# Patient Record
Sex: Male | Born: 1940 | Race: White | Hispanic: No | Marital: Married | Smoking: Former smoker
Health system: Southern US, Community
[De-identification: ages and names within clinical notes are randomized; demographics above are authoritative.]

## PROBLEM LIST (undated history)

## (undated) DIAGNOSIS — E7800 Pure hypercholesterolemia, unspecified: Secondary | ICD-10-CM

## (undated) DIAGNOSIS — M1990 Unspecified osteoarthritis, unspecified site: Secondary | ICD-10-CM

## (undated) DIAGNOSIS — Z7901 Long term (current) use of anticoagulants: Secondary | ICD-10-CM

## (undated) DIAGNOSIS — I4892 Unspecified atrial flutter: Secondary | ICD-10-CM

## (undated) DIAGNOSIS — I10 Essential (primary) hypertension: Secondary | ICD-10-CM

## (undated) DIAGNOSIS — N200 Calculus of kidney: Secondary | ICD-10-CM

## (undated) DIAGNOSIS — R001 Bradycardia, unspecified: Secondary | ICD-10-CM

## (undated) DIAGNOSIS — C61 Malignant neoplasm of prostate: Secondary | ICD-10-CM

## (undated) HISTORY — PX: SHOULDER DEBRIDEMENT: SHX1052

## (undated) HISTORY — PX: KIDNEY SURGERY: SHX687

## (undated) HISTORY — PX: PROSTATE SURGERY: SHX751

## (undated) HISTORY — PX: KIDNEY STONE SURGERY: SHX686

## (undated) HISTORY — PX: JOINT REPLACEMENT: SHX530

## (undated) HISTORY — PX: THYROID SURGERY: SHX805

## (undated) HISTORY — PX: TOTAL KNEE ARTHROPLASTY: SHX125

## (undated) HISTORY — PX: BACK SURGERY: SHX140

---

## 2009-12-22 ENCOUNTER — Emergency Department (HOSPITAL_BASED_OUTPATIENT_CLINIC_OR_DEPARTMENT_OTHER): Admission: EM | Admit: 2009-12-22 | Discharge: 2009-12-22 | Payer: Self-pay | Admitting: Emergency Medicine

## 2010-10-25 ENCOUNTER — Emergency Department (HOSPITAL_BASED_OUTPATIENT_CLINIC_OR_DEPARTMENT_OTHER)
Admission: EM | Admit: 2010-10-25 | Discharge: 2010-10-25 | Disposition: A | Discharge status: Home / Self Care | Payer: Medicare Other | Attending: Emergency Medicine | Admitting: Emergency Medicine

## 2010-10-25 DIAGNOSIS — I10 Essential (primary) hypertension: Secondary | ICD-10-CM | POA: Insufficient documentation

## 2010-10-25 DIAGNOSIS — Z794 Long term (current) use of insulin: Secondary | ICD-10-CM | POA: Insufficient documentation

## 2010-10-25 DIAGNOSIS — Z79899 Other long term (current) drug therapy: Secondary | ICD-10-CM | POA: Insufficient documentation

## 2010-10-25 DIAGNOSIS — R42 Dizziness and giddiness: Secondary | ICD-10-CM | POA: Insufficient documentation

## 2010-10-25 DIAGNOSIS — Z8639 Personal history of other endocrine, nutritional and metabolic disease: Secondary | ICD-10-CM | POA: Insufficient documentation

## 2010-10-25 DIAGNOSIS — E119 Type 2 diabetes mellitus without complications: Secondary | ICD-10-CM | POA: Insufficient documentation

## 2010-10-25 DIAGNOSIS — R110 Nausea: Secondary | ICD-10-CM | POA: Insufficient documentation

## 2010-10-25 DIAGNOSIS — Z862 Personal history of diseases of the blood and blood-forming organs and certain disorders involving the immune mechanism: Secondary | ICD-10-CM | POA: Insufficient documentation

## 2010-10-25 LAB — BASIC METABOLIC PANEL
BUN: 21 mg/dL (ref 6–23)
Calcium: 9.7 mg/dL (ref 8.4–10.5)
GFR calc non Af Amer: >60 mL/min (ref >60)
Glucose, Bld: 206 mg/dL — ABNORMAL HIGH (ref 70–99)

## 2010-10-25 LAB — PROTIME-INR: Prothrombin Time: 22.8 seconds — ABNORMAL HIGH (ref 11.6–15.2)

## 2010-10-25 LAB — CBC
MCH: 28.1 pg (ref 26.0–34.0)
MCHC: 33.4 g/dL (ref 30.0–36.0)
Platelets: 136 10*3/uL — ABNORMAL LOW (ref 150–400)
RDW: 15 % (ref 11.5–15.5)

## 2011-11-05 ENCOUNTER — Emergency Department (INDEPENDENT_AMBULATORY_CARE_PROVIDER_SITE_OTHER): Payer: Medicare Other

## 2011-11-05 ENCOUNTER — Encounter (HOSPITAL_BASED_OUTPATIENT_CLINIC_OR_DEPARTMENT_OTHER): Payer: Self-pay | Admitting: *Deleted

## 2011-11-05 ENCOUNTER — Emergency Department (HOSPITAL_BASED_OUTPATIENT_CLINIC_OR_DEPARTMENT_OTHER)
Admission: EM | Admit: 2011-11-05 | Discharge: 2011-11-05 | Disposition: A | Discharge status: Home / Self Care | Payer: Medicare Other | Attending: Emergency Medicine | Admitting: Emergency Medicine

## 2011-11-05 DIAGNOSIS — M5380 Other specified dorsopathies, site unspecified: Secondary | ICD-10-CM | POA: Insufficient documentation

## 2011-11-05 DIAGNOSIS — E7800 Pure hypercholesterolemia, unspecified: Secondary | ICD-10-CM | POA: Insufficient documentation

## 2011-11-05 DIAGNOSIS — X58XXXA Exposure to other specified factors, initial encounter: Secondary | ICD-10-CM | POA: Insufficient documentation

## 2011-11-05 DIAGNOSIS — M62830 Muscle spasm of back: Secondary | ICD-10-CM

## 2011-11-05 DIAGNOSIS — E119 Type 2 diabetes mellitus without complications: Secondary | ICD-10-CM | POA: Insufficient documentation

## 2011-11-05 DIAGNOSIS — R109 Unspecified abdominal pain: Secondary | ICD-10-CM | POA: Insufficient documentation

## 2011-11-05 DIAGNOSIS — N200 Calculus of kidney: Secondary | ICD-10-CM

## 2011-11-05 DIAGNOSIS — S335XXA Sprain of ligaments of lumbar spine, initial encounter: Secondary | ICD-10-CM | POA: Insufficient documentation

## 2011-11-05 DIAGNOSIS — S39012A Strain of muscle, fascia and tendon of lower back, initial encounter: Secondary | ICD-10-CM

## 2011-11-05 DIAGNOSIS — M5417 Radiculopathy, lumbosacral region: Secondary | ICD-10-CM

## 2011-11-05 DIAGNOSIS — IMO0002 Reserved for concepts with insufficient information to code with codable children: Secondary | ICD-10-CM | POA: Insufficient documentation

## 2011-11-05 DIAGNOSIS — Z79899 Other long term (current) drug therapy: Secondary | ICD-10-CM | POA: Insufficient documentation

## 2011-11-05 DIAGNOSIS — I10 Essential (primary) hypertension: Secondary | ICD-10-CM | POA: Insufficient documentation

## 2011-11-05 HISTORY — DX: Pure hypercholesterolemia, unspecified: E78.00

## 2011-11-05 HISTORY — DX: Essential (primary) hypertension: I10

## 2011-11-05 HISTORY — DX: Unspecified osteoarthritis, unspecified site: M19.90

## 2011-11-05 LAB — BASIC METABOLIC PANEL
BUN: 32 mg/dL — ABNORMAL HIGH (ref 6–23)
Calcium: 9.4 mg/dL (ref 8.4–10.5)
Creatinine, Ser: 1 mg/dL (ref 0.50–1.35)
GFR calc Af Amer: 86 mL/min — ABNORMAL LOW (ref >90)
GFR calc non Af Amer: 74 mL/min — ABNORMAL LOW (ref >90)
Potassium: 4.1 mEq/L (ref 3.5–5.1)

## 2011-11-05 LAB — CBC
Hemoglobin: 14.3 g/dL (ref 13.0–17.0)
MCH: 27.6 pg (ref 26.0–34.0)
MCHC: 33.3 g/dL (ref 30.0–36.0)

## 2011-11-05 LAB — DIFFERENTIAL
Basophils Relative: 0 % (ref 0–1)
Eosinophils Absolute: 0.3 10*3/uL (ref 0.0–0.7)
Eosinophils Relative: 5 % (ref 0–5)
Monocytes Relative: 8 % (ref 3–12)
Neutrophils Relative %: 51 % (ref 43–77)

## 2011-11-05 LAB — URINALYSIS, ROUTINE W REFLEX MICROSCOPIC
Bilirubin Urine: NEGATIVE
Ketones, ur: NEGATIVE mg/dL
Nitrite: NEGATIVE
Urobilinogen, UA: 0.2 mg/dL (ref 0.0–1.0)
pH: 7 (ref 5.0–8.0)

## 2011-11-05 MED ORDER — ONDANSETRON HCL 4 MG/2ML IJ SOLN
4.0000 mg | Freq: Once | INTRAMUSCULAR | Status: AC
  Administered 2011-11-05: 4 mg via INTRAVENOUS
  Filled 2011-11-05: qty 2

## 2011-11-05 MED ORDER — HYDROMORPHONE HCL PF 2 MG/ML IJ SOLN
2.0000 mg | Freq: Once | INTRAMUSCULAR | Status: AC
  Administered 2011-11-05: 2 mg via INTRAVENOUS
  Filled 2011-11-05: qty 1

## 2011-11-05 MED ORDER — DIAZEPAM 5 MG PO TABS: 5.0000 mg | ORAL_TABLET | Freq: Four times a day (QID) | ORAL | Status: AC | PRN

## 2011-11-05 MED ORDER — DIAZEPAM 5 MG PO TABS
5.0000 mg | ORAL_TABLET | Freq: Once | ORAL | Status: AC
  Administered 2011-11-05: 5 mg via ORAL
  Filled 2011-11-05: qty 1

## 2011-11-05 MED ORDER — OXYCODONE-ACETAMINOPHEN 5-325 MG PO TABS: 2.0000 | ORAL_TABLET | ORAL | Status: AC | PRN

## 2011-11-05 MED ORDER — PREDNISONE 20 MG PO TABS: 40.0000 mg | ORAL_TABLET | Freq: Every day | ORAL | Status: AC

## 2011-11-05 MED ORDER — KETOROLAC TROMETHAMINE 15 MG/ML IJ SOLN
15.0000 mg | Freq: Once | INTRAMUSCULAR | Status: AC
  Administered 2011-11-05: 15 mg via INTRAVENOUS
  Filled 2011-11-05: qty 1

## 2011-11-05 MED ORDER — HYDROMORPHONE HCL PF 1 MG/ML IJ SOLN
1.0000 mg | Freq: Once | INTRAMUSCULAR | Status: AC
  Administered 2011-11-05: 1 mg via INTRAVENOUS
  Filled 2011-11-05: qty 1

## 2011-11-05 NOTE — ED Notes (Signed)
Pt. Reports he has R flank pain into the R groin area.  Pt. Reports no burning or hurting with urination.

## 2011-11-05 NOTE — Discharge Instructions (Signed)
Back Pain, Adult Low back pain is very common. About 1 in 5 people have back pain.The cause of low back pain is rarely dangerous. The pain often gets better over time.About half of people with a sudden onset of back pain feel better in just 2 weeks. About 8 in 10 people feel better by 6 weeks.  CAUSES Some common causes of back pain include:  Strain of the muscles or ligaments supporting the spine.   Wear and tear (degeneration) of the spinal discs.   Arthritis.   Direct injury to the back.  DIAGNOSIS Most of the time, the direct cause of low back pain is not known.However, back pain can be treated effectively even when the exact cause of the pain is unknown.Answering your caregiver's questions about your overall health and symptoms is one of the most accurate ways to make sure the cause of your pain is not dangerous. If your caregiver needs more information, he or she may order lab work or imaging tests (X-rays or MRIs).However, even if imaging tests show changes in your back, this usually does not require surgery. HOME CARE INSTRUCTIONS For many people, back pain returns.Since low back pain is rarely dangerous, it is often a condition that people can learn to manageon their own.   Remain active. It is stressful on the back to sit or stand in one place. Do not sit, drive, or stand in one place for more than 30 minutes at a time. Take short walks on level surfaces as soon as pain allows.Try to increase the length of time you walk each day.   Do not stay in bed.Resting more than 1 or 2 days can delay your recovery.   Do not avoid exercise or work.Your body is made to move.It is not dangerous to be active, even though your back may hurt.Your back will likely heal faster if you return to being active before your pain is gone.   Pay attention to your body when you bend and lift. Many people have less discomfortwhen lifting if they bend their knees, keep the load close to their  bodies,and avoid twisting. Often, the most comfortable positions are those that put less stress on your recovering back.   Find a comfortable position to sleep. Use a firm mattress and lie on your side with your knees slightly bent. If you lie on your back, put a pillow under your knees.   Only take over-the-counter or prescription medicines as directed by your caregiver. Over-the-counter medicines to reduce pain and inflammation are often the most helpful.Your caregiver may prescribe muscle relaxant drugs.These medicines help dull your pain so you can more quickly return to your normal activities and healthy exercise.   Put ice on the injured area.   Put ice in a plastic bag.   Place a towel between your skin and the bag.   Leave the ice on for 15 to 20 minutes, 3 to 4 times a day for the first 2 to 3 days. After that, ice and heat may be alternated to reduce pain and spasms.   Ask your caregiver about trying back exercises and gentle massage. This may be of some benefit.   Avoid feeling anxious or stressed.Stress increases muscle tension and can worsen back pain.It is important to recognize when you are anxious or stressed and learn ways to manage it.Exercise is a great option.  SEEK MEDICAL CARE IF:  You have pain that is not relieved with rest or medicine.   You have   pain that does not improve in 1 week.   You have new symptoms.   You are generally not feeling well.  SEEK IMMEDIATE MEDICAL CARE IF:   You have pain that radiates from your back into your legs.   You develop new bowel or bladder control problems.   You have unusual weakness or numbness in your arms or legs.   You develop nausea or vomiting.   You develop abdominal pain.   You feel faint.  Document Released: 08/10/2005 Document Revised: 07/30/2011 Document Reviewed: 12/29/2010 [facility] Patient Information 2012 [place], [place].Cryotherapy Cryotherapy means treatment with cold. Ice or gel packs can be  used to reduce both pain and swelling. Ice is the most helpful within the first 24 to 48 hours after an injury or flareup from overusing a muscle or joint. Sprains, strains, spasms, burning pain, shooting pain, and aches can all be eased with ice. Ice can also be used when recovering from surgery. Ice is effective, has very few side effects, and is safe for most people to use. PRECAUTIONS  Ice is not a safe treatment option for people with:  Raynaud's phenomenon. This is a condition affecting small blood vessels in the extremities. Exposure to cold may cause your problems to return.   Cold hypersensitivity. There are many forms of cold hypersensitivity, including:   Cold urticaria. Red, itchy hives appear on the skin when the tissues begin to warm after being iced.   Cold erythema. This is a red, itchy rash caused by exposure to cold.   Cold hemoglobinuria. Red blood cells break down when the tissues begin to warm after being iced. The hemoglobin that carry oxygen are passed into the urine because they cannot combine with blood proteins fast enough.   Numbness or altered sensitivity in the area being iced.  If you have any of the following conditions, do not use ice until you have discussed cryotherapy with your caregiver:  Heart conditions, such as arrhythmia, angina, or chronic heart disease.   High blood pressure.   Healing wounds or open skin in the area being iced.   Current infections.   Rheumatoid arthritis.   Poor circulation.   Diabetes.  Ice slows the blood flow in the region it is applied. This is beneficial when trying to stop inflamed tissues from spreading irritating chemicals to surrounding tissues. However, if you expose your skin to cold temperatures for too long or without the proper protection, you can damage your skin or nerves. Watch for signs of skin damage due to cold. HOME CARE INSTRUCTIONS Follow these tips to use ice and cold packs safely.  Place a dry or  damp towel between the ice and skin. A damp towel will cool the skin more quickly, so you may need to shorten the time that the ice is used.   For a more rapid response, add gentle compression to the ice.   Ice for no more than 10 to 20 minutes at a time. The bonier the area you are icing, the less time it will take to get the benefits of ice.   Check your skin after 5 minutes to make sure there are no signs of a poor response to cold or skin damage.   Rest 20 minutes or more in between uses.   Once your skin is numb, you can end your treatment. You can test numbness by very lightly touching your skin. The touch should be so light that you do not see the skin dimple from  the pressure of your fingertip. When using ice, most people will feel these normal sensations in this order: cold, burning, aching, and numbness.   Do not use ice on someone who cannot communicate their responses to pain, such as small children or people with dementia.  HOW TO MAKE AN ICE PACK Ice packs are the most common way to use ice therapy. Other methods include ice massage, ice baths, and cryo-sprays. Muscle creams that cause a cold, tingly feeling do not offer the same benefits that ice offers and should not be used as a substitute unless recommended by your caregiver. To make an ice pack, do one of the following:  Place crushed ice or a bag of frozen vegetables in a sealable plastic bag. Squeeze out the excess air. Place this bag inside another plastic bag. Slide the bag into a pillowcase or place a damp towel between your skin and the bag.   Mix 3 parts water with 1 part rubbing alcohol. Freeze the mixture in a sealable plastic bag. When you remove the mixture from the freezer, it will be slushy. Squeeze out the excess air. Place this bag inside another plastic bag. Slide the bag into a pillowcase or place a damp towel between your skin and the bag.  SEEK MEDICAL CARE IF:  You develop white spots on your skin. This  may give the skin a blotchy (mottled) appearance.   Your skin turns blue or pale.   Your skin becomes waxy or hard.   Your swelling gets worse.  MAKE SURE YOU:   Understand these instructions.   Will watch your condition.   Will get help right away if you are not doing well or get worse.  Document Released: 04/06/2011 Document Revised: 07/30/2011 Document Reviewed: 04/06/2011 [facility] Patient Information 2012 [place], [place].Lumbosacral Strain Lumbosacral strain is one of the most common causes of back pain. There are many causes of back pain. Most are not serious conditions. CAUSES  Your backbone (spinal column) is made up of 24 main vertebral bodies, the sacrum, and the coccyx. These are held together by muscles and tough, fibrous tissue (ligaments). Nerve roots pass through the openings between the vertebrae. A sudden move or injury to the back may cause injury to, or pressure on, these nerves. This may result in localized back pain or pain movement (radiation) into the buttocks, down the leg, and into the foot. Sharp, shooting pain from the buttock down the back of the leg (sciatica) is frequently associated with a ruptured (herniated) disk. Pain may be caused by muscle spasm alone. Your caregiver can often find the cause of your pain by the details of your symptoms and an exam. In some cases, you may need tests (such as X-rays). Your caregiver will work with you to decide if any tests are needed based on your specific exam. HOME CARE INSTRUCTIONS   Avoid an underactive lifestyle. Active exercise, as directed by your caregiver, is your greatest weapon against back pain.   Avoid hard physical activities (tennis, racquetball, waterskiing) if you are not in proper physical condition for it. This may aggravate or create problems.   If you have a back problem, avoid sports requiring sudden body movements. Swimming and walking are generally safer activities.   Maintain good posture.    Avoid becoming overweight (obese).   Use bed rest for only the most extreme, sudden (acute) episode. Your caregiver will help you determine how much bed rest is necessary.   For acute conditions, you may  put ice on the injured area.   Put ice in a plastic bag.   Place a towel between your skin and the bag.   Leave the ice on for 15 to 20 minutes at a time, every 2 hours, or as needed.   After you are improved and more active, it may help to apply heat for 30 minutes before activities.  See your caregiver if you are having pain that lasts longer than expected. Your caregiver can advise appropriate exercises or therapy if needed. With conditioning, most back problems can be avoided. SEEK IMMEDIATE MEDICAL CARE IF:   You have numbness, tingling, weakness, or problems with the use of your arms or legs.   You experience severe back pain not relieved with medicines.   There is a change in bowel or bladder control.   You have increasing pain in any area of the body, including your belly (abdomen).   You notice shortness of breath, dizziness, or feel faint.   You feel sick to your stomach (nauseous), are throwing up (vomiting), or become sweaty.   You notice discoloration of your toes or legs, or your feet get very cold.   Your back pain is getting worse.   You have a fever.  MAKE SURE YOU:   Understand these instructions.   Will watch your condition.   Will get help right away if you are not doing well or get worse.  Document Released: 05/20/2005 Document Revised: 07/30/2011 Document Reviewed: 11/09/2008 [facility] Patient Information 2012 [place], [place].Lumbosacral Radiculopathy Lumbosacral radiculopathy is a pinched nerve or nerves in the low back (lumbosacral area). When this happens you may have weakness in your legs and may not be able to stand on your toes. You may have pain going down into your legs. There may be difficulties with walking normally. There are many causes  of this problem. Sometimes this may happen from an injury, or simply from arthritis or boney problems. It may also be caused by other illnesses such as diabetes. If there is no improvement after treatment, further studies may be done to find the exact cause. DIAGNOSIS  X-rays may be needed if the problems become long standing. Electromyograms may be done. This study is one in which the working of nerves and muscles is studied. HOME CARE INSTRUCTIONS   Applications of ice packs may be helpful. Ice can be used in a plastic bag with a towel around it to prevent frostbite to skin. This may be used every 2 hours for 20 to 30 minutes, or as needed, while awake, or as directed by your caregiver.   Only take over-the-counter or prescription medicines for pain, discomfort, or fever as directed by your caregiver.   If physical therapy was prescribed, follow your caregiver's directions.  SEEK IMMEDIATE MEDICAL CARE IF:   You have pain not controlled with medications.   You seem to be getting worse rather than better.   You develop increasing weakness in your legs.   You develop loss of bowel or bladder control.   You have difficulty with walking or balance, or develop clumsiness in the use of your legs.   You have a fever.  MAKE SURE YOU:   Understand these instructions.   Will watch your condition.   Will get help right away if you are not doing well or get worse.  Document Released: 08/10/2005 Document Revised: 07/30/2011 Document Reviewed: 03/30/2008 [facility] Patient Information 2012 [place], [place].

## 2011-11-05 NOTE — ED Provider Notes (Addendum)
History     CSN: [id number]  Arrival date & time 11/05/11  1313   First MD Initiated Contact with Patient 11/05/11 1406      Chief Complaint  Patient presents with  . Flank Pain    R side into the R groin area    (Consider location/radiation/quality/duration/timing/severity/associated sxs/prior treatment) HPI Comments: The patient has a known history of intrarenal kidney stone found incidentally in the past on x-ray, but he has never passed a kidney stone before. He reports 3-4 days ago insidious onset of right flank pain, dull, aching, nonradiating, fleeting and intermittent, that over the last 24 hours has gradually become worse in severity, now severe, localized to the right flank, right abdomen, and right groin. He denies any dysuria, hematuria, urinary hesitancy or urinary frequency. He does report some nausea.  Patient is a 71 y.o. male presenting with flank pain. The history is provided by the patient.  Flank Pain This is a new problem. The current episode started more than 2 days ago (3-4 days ago, the pain onset was mild and fleeting, and subsequently has become severe and persistent, originally at the right flank, now at the right abdomen and right groin). The problem occurs constantly. The problem has been gradually worsening. Associated symptoms include abdominal pain. Pertinent negatives include no chest pain, no headaches and no shortness of breath. The symptoms are aggravated by nothing. The symptoms are relieved by nothing. He has tried nothing for the symptoms.    Past Medical History  Diagnosis Date  . Arthritis   . Gout   . Diabetes mellitus   . Hypertension   . High cholesterol   . Atrial fibrillation     was placed on coumadin     Past Surgical History  Procedure Date  . Kidney stone surgery   . Thyroid surgery   . Kidney surgery   . Shoulder debridement     L and R  . Prostate surgery     had prostate cancer approx. 3 yrs ago    No family history on  file.  History  Substance Use Topics  . Smoking status: Not on file  . Smokeless tobacco: Not on file  . Alcohol Use:       Review of Systems  Constitutional: Negative for fever, chills, activity change, appetite change and fatigue.  HENT: Negative.   Eyes: Negative.   Respiratory: Negative for cough, shortness of breath and wheezing.   Cardiovascular: Negative for chest pain.  Gastrointestinal: Positive for nausea, vomiting and abdominal pain. Negative for diarrhea, constipation, blood in stool, abdominal distention, anal bleeding and rectal pain.  Genitourinary: Positive for urgency and flank pain. Negative for dysuria, frequency, hematuria, decreased urine volume, discharge, difficulty urinating, genital sores, penile pain and testicular pain.  Musculoskeletal: Negative.   Skin: Negative for color change and rash.  Neurological: Negative for light-headedness, numbness and headaches.  Hematological: Does not bruise/bleed easily.  Psychiatric/Behavioral: Negative.     Allergies  Penicillins  Home Medications   Current Outpatient Rx  Name Route Sig Dispense Refill  . ACYCLOVIR 200 MG PO CAPS Oral Take by mouth every 4 (four) hours while awake.    Marland Kitchen ALLOPURINOL 300 MG PO TABS Oral Take 300 mg by mouth daily.    . CHLORTHALIDONE 25 MG PO TABS Oral Take 25 mg by mouth daily.    . FENOFIBRATE 160 MG PO TABS Oral Take 160 mg by mouth daily.    . INSULIN GLARGINE 100 UNIT/ML [place]  SOLN Subcutaneous Inject into the skin at bedtime.    . INSULIN LISPRO (HUMAN) 100 UNIT/ML [place] SOLN Subcutaneous Inject into the skin 3 (three) times daily before meals.    Marland Kitchen LOSARTAN POTASSIUM-HCTZ 100-12.5 MG PO TABS Oral Take 1 tablet by mouth daily.    Marland Kitchen METFORMIN HCL 1000 MG PO TABS Oral Take 1,000 mg by mouth 2 (two) times daily with a meal.    . OMEPRAZOLE 40 MG PO CPDR Oral Take 40 mg by mouth daily.    Marland Kitchen PREGABALIN 100 MG PO CAPS Oral Take 100 mg by mouth 2 (two) times daily.    Marland Kitchen SIMVASTATIN 10 MG  PO TABS Oral Take 10 mg by mouth at bedtime.    . WARFARIN SODIUM 5 MG PO TABS Oral Take 5 mg by mouth daily.      BP 131/65  Pulse 50  Temp(Src) 97.6 F (36.4 C) (Oral)  Resp 20  Ht 6' (1.829 m)  Wt 253 lb (114.76 kg)  BMI 34.31 kg/m2  SpO2 98%  Physical Exam  Nursing note and vitals reviewed. Constitutional: He is oriented to person, place, and time. He appears well-nourished. He appears distressed.  HENT:  Head: Normocephalic and atraumatic.  Eyes: EOM are normal. Pupils are equal, round, and reactive to light.  Neck: Normal range of motion. Neck supple. No JVD present.  Cardiovascular: Normal rate, regular rhythm, normal heart sounds and intact distal pulses.  Exam reveals no gallop and no friction rub.   No murmur heard. Pulmonary/Chest: Effort normal and breath sounds normal. No respiratory distress. He has no wheezes. He has no rales. He exhibits no tenderness.  Abdominal: Soft. Bowel sounds are normal. He exhibits no distension, no fluid wave, no ascites and no mass. There is no hepatosplenomegaly. There is tenderness. There is CVA tenderness. There is no rebound and no guarding. No hernia.  Musculoskeletal: Normal range of motion. He exhibits no edema and no tenderness.  Neurological: He is alert and oriented to person, place, and time. He has normal reflexes. No cranial nerve deficit. He exhibits normal muscle tone. Coordination normal.  Skin: Skin is warm and dry. No rash noted. He is not diaphoretic. No erythema. No pallor.  Psychiatric: He has a normal mood and affect. His behavior is normal. Judgment and thought content normal.    ED Course  Procedures (including critical care time)   Labs Reviewed  URINALYSIS, ROUTINE W REFLEX MICROSCOPIC  CBC  DIFFERENTIAL  BASIC METABOLIC PANEL   No results found.   No diagnosis found.    MDM  The patient has right flank pain radiating to the right abdomen and right groin, severe in intensity, associated with nausea,  and not associated with recent injury. He has a known kidney stone on the right side from prior x-ray he tells me, and his symptoms are strongly suggestive of acute ureteric lithiasis and renal colic secondary to a kidney stone. I have ordered him analgesics and anti-emetics to treat his symptoms while awaiting results of blood work, urinalysis, and CT scan to evaluate further.       Felisa Bonier, MD 11/05/11 1450  4:15 PM The patient's urinalysis and CT scan do not suggest ureteric lithiasis or kidney stone as the etiology of his symptoms. Looking at the degenerative changes in his lumbar spine, while there are no acute fractures or dislocations, I think likely the patient is experiencing radicular lumbar pain, likely secondary to muscle strain and spasm. I have given the patient  muscle relaxant in the emergency department at this time he is awake, alert, and oriented appropriately and states his pain is significantly improved, now 3/10 in intensity. I have discussed with him the results of his tests and what I now think to be the diagnosis of lumbar strain, muscle spasm, and radicular lumbar pain. The patient states his understanding of these findings and diagnosis, as well as my plan to treat him with analgesics, muscle relaxants, and a brief course of corticosteroids. I have instructed him on carefully monitoring his blood glucose during use of corticosteroids in adjusting his insulin use accordingly. Additionally, I have instructed him that he needs to followup with his primary care physician within the next 4 days to be reevaluated and to have his Coumadin level, his INR rechecked to assure that the steroids have not significantly altered the INR. He states his understanding of and agreement with the plan of care.  Felisa Bonier, MD 11/05/11 [phone]

## 2012-05-01 ENCOUNTER — Observation Stay (HOSPITAL_BASED_OUTPATIENT_CLINIC_OR_DEPARTMENT_OTHER)
Admission: EM | Admit: 2012-05-01 | Discharge: 2012-05-03 | Disposition: A | Discharge status: Home / Self Care | Payer: Medicare Other | Attending: Internal Medicine | Admitting: Internal Medicine

## 2012-05-01 ENCOUNTER — Emergency Department (HOSPITAL_BASED_OUTPATIENT_CLINIC_OR_DEPARTMENT_OTHER): Payer: Medicare Other

## 2012-05-01 ENCOUNTER — Encounter (HOSPITAL_BASED_OUTPATIENT_CLINIC_OR_DEPARTMENT_OTHER): Payer: Self-pay | Admitting: Emergency Medicine

## 2012-05-01 DIAGNOSIS — Z7901 Long term (current) use of anticoagulants: Secondary | ICD-10-CM

## 2012-05-01 DIAGNOSIS — R0789 Other chest pain: Secondary | ICD-10-CM | POA: Insufficient documentation

## 2012-05-01 DIAGNOSIS — I4891 Unspecified atrial fibrillation: Secondary | ICD-10-CM | POA: Diagnosis present

## 2012-05-01 DIAGNOSIS — R0609 Other forms of dyspnea: Secondary | ICD-10-CM | POA: Insufficient documentation

## 2012-05-01 DIAGNOSIS — I10 Essential (primary) hypertension: Secondary | ICD-10-CM | POA: Diagnosis present

## 2012-05-01 DIAGNOSIS — E119 Type 2 diabetes mellitus without complications: Secondary | ICD-10-CM | POA: Diagnosis present

## 2012-05-01 DIAGNOSIS — E785 Hyperlipidemia, unspecified: Secondary | ICD-10-CM | POA: Insufficient documentation

## 2012-05-01 DIAGNOSIS — M549 Dorsalgia, unspecified: Principal | ICD-10-CM | POA: Diagnosis present

## 2012-05-01 DIAGNOSIS — R0989 Other specified symptoms and signs involving the circulatory and respiratory systems: Secondary | ICD-10-CM | POA: Insufficient documentation

## 2012-05-01 DIAGNOSIS — I4892 Unspecified atrial flutter: Secondary | ICD-10-CM | POA: Diagnosis present

## 2012-05-01 DIAGNOSIS — R001 Bradycardia, unspecified: Secondary | ICD-10-CM

## 2012-05-01 DIAGNOSIS — I498 Other specified cardiac arrhythmias: Secondary | ICD-10-CM | POA: Insufficient documentation

## 2012-05-01 HISTORY — DX: Unspecified atrial flutter: I48.92

## 2012-05-01 HISTORY — DX: Malignant neoplasm of prostate: C61

## 2012-05-01 HISTORY — DX: Long term (current) use of anticoagulants: Z79.01

## 2012-05-01 HISTORY — DX: Bradycardia, unspecified: R00.1

## 2012-05-01 LAB — GLUCOSE, CAPILLARY
Glucose-Capillary: 111 mg/dL — ABNORMAL HIGH (ref 70–99)
Glucose-Capillary: 130 mg/dL — ABNORMAL HIGH (ref 70–99)

## 2012-05-01 LAB — COMPREHENSIVE METABOLIC PANEL
ALT: 16 U/L (ref 0–53)
AST: 18 U/L (ref 0–37)
CO2: 29 mEq/L (ref 19–32)
Calcium: 9.3 mg/dL (ref 8.4–10.5)
Chloride: 101 mEq/L (ref 96–112)
GFR calc Af Amer: >90 mL/min (ref >90)
GFR calc non Af Amer: 84 mL/min — ABNORMAL LOW (ref >90)
Glucose, Bld: 178 mg/dL — ABNORMAL HIGH (ref 70–99)
Sodium: 142 mEq/L (ref 135–145)
Total Bilirubin: 0.5 mg/dL (ref 0.3–1.2)

## 2012-05-01 LAB — CBC WITH DIFFERENTIAL/PLATELET
Basophils Absolute: 0 10*3/uL (ref 0.0–0.1)
Eosinophils Relative: 4 % (ref 0–5)
HCT: 45.1 % (ref 39.0–52.0)
Lymphocytes Relative: 35 % (ref 12–46)
Lymphs Abs: 2.7 10*3/uL (ref 0.7–4.0)
MCV: 82.6 fL (ref 78.0–100.0)
Monocytes Absolute: 0.7 10*3/uL (ref 0.1–1.0)
Neutro Abs: 4.1 10*3/uL (ref 1.7–7.7)
Platelets: 146 10*3/uL — ABNORMAL LOW (ref 150–400)
RBC: 5.46 MIL/uL (ref 4.22–5.81)
RDW: 17.7 % — ABNORMAL HIGH (ref 11.5–15.5)
WBC: 7.8 10*3/uL (ref 4.0–10.5)

## 2012-05-01 MED ORDER — HYDROCHLOROTHIAZIDE 12.5 MG PO CAPS: 12.5000 mg | ORAL_CAPSULE | Freq: Every day | ORAL | Status: DC

## 2012-05-01 MED ORDER — SODIUM CHLORIDE 0.9 % IJ SOLN
3.0000 mL | Freq: Two times a day (BID) | INTRAMUSCULAR | Status: DC
  Administered 2012-05-01: 3 mL via INTRAVENOUS
  Administered 2012-05-02: 11:00:00 via INTRAVENOUS
  Administered 2012-05-02: 3 mL via INTRAVENOUS

## 2012-05-01 MED ORDER — CHLORTHALIDONE 25 MG PO TABS
25.0000 mg | ORAL_TABLET | Freq: Every day | ORAL | Status: DC
  Administered 2012-05-02 – 2012-05-03 (×2): 25 mg via ORAL
  Filled 2012-05-01 (×2): qty 1

## 2012-05-01 MED ORDER — ONDANSETRON HCL 4 MG/2ML IJ SOLN
4.0000 mg | Freq: Once | INTRAMUSCULAR | Status: AC
  Administered 2012-05-01: 4 mg via INTRAVENOUS
  Filled 2012-05-01: qty 2

## 2012-05-01 MED ORDER — IOHEXOL 350 MG/ML SOLN
100.0000 mL | Freq: Once | INTRAVENOUS | Status: AC | PRN
  Administered 2012-05-01: 100 mL via INTRAVENOUS

## 2012-05-01 MED ORDER — LOSARTAN POTASSIUM 50 MG PO TABS
100.0000 mg | ORAL_TABLET | Freq: Every day | ORAL | Status: DC
  Administered 2012-05-02 – 2012-05-03 (×2): 100 mg via ORAL
  Filled 2012-05-01 (×2): qty 2

## 2012-05-01 MED ORDER — WARFARIN SODIUM 7.5 MG PO TABS
7.5000 mg | ORAL_TABLET | Freq: Once | ORAL | Status: AC
  Administered 2012-05-01: 7.5 mg via ORAL
  Filled 2012-05-01: qty 1

## 2012-05-01 MED ORDER — ONDANSETRON HCL 4 MG/2ML IJ SOLN: 4.0000 mg | Freq: Three times a day (TID) | INTRAMUSCULAR | Status: AC | PRN

## 2012-05-01 MED ORDER — NITROGLYCERIN IN D5W 200-5 MCG/ML-% IV SOLN
2.0000 ug/min | Freq: Once | INTRAVENOUS | Status: AC
  Administered 2012-05-01: 2 ug/min via INTRAVENOUS
  Filled 2012-05-01: qty 250

## 2012-05-01 MED ORDER — MORPHINE SULFATE 4 MG/ML IJ SOLN
4.0000 mg | Freq: Once | INTRAMUSCULAR | Status: AC
  Administered 2012-05-01: 4 mg via INTRAVENOUS
  Filled 2012-05-01: qty 1

## 2012-05-01 MED ORDER — ZOLPIDEM TARTRATE 5 MG PO TABS
5.0000 mg | ORAL_TABLET | Freq: Every evening | ORAL | Status: DC | PRN
  Administered 2012-05-02: 5 mg via ORAL
  Filled 2012-05-01: qty 1

## 2012-05-01 MED ORDER — PREGABALIN 100 MG PO CAPS
200.0000 mg | ORAL_CAPSULE | Freq: Three times a day (TID) | ORAL | Status: DC
  Administered 2012-05-01 – 2012-05-03 (×5): 200 mg via ORAL
  Filled 2012-05-01 (×2): qty 2
  Filled 2012-05-01 (×2): qty 4
  Filled 2012-05-01: qty 2

## 2012-05-01 MED ORDER — FENOFIBRATE 160 MG PO TABS
160.0000 mg | ORAL_TABLET | Freq: Every day | ORAL | Status: DC
  Administered 2012-05-02 – 2012-05-03 (×2): 160 mg via ORAL
  Filled 2012-05-01 (×2): qty 1

## 2012-05-01 MED ORDER — ACETAMINOPHEN 325 MG PO TABS
650.0000 mg | ORAL_TABLET | ORAL | Status: DC | PRN
  Administered 2012-05-01 – 2012-05-03 (×2): 650 mg via ORAL
  Filled 2012-05-01 (×2): qty 2

## 2012-05-01 MED ORDER — INSULIN ASPART 100 UNIT/ML ~~LOC~~ SOLN
0.0000 [IU] | Freq: Three times a day (TID) | SUBCUTANEOUS | Status: DC
  Administered 2012-05-02: 2 [IU] via SUBCUTANEOUS
  Administered 2012-05-02: 3 [IU] via SUBCUTANEOUS

## 2012-05-01 MED ORDER — INSULIN GLARGINE 100 UNIT/ML ~~LOC~~ SOLN
70.0000 [IU] | Freq: Every day | SUBCUTANEOUS | Status: DC
  Administered 2012-05-01 – 2012-05-02 (×2): 70 [IU] via SUBCUTANEOUS

## 2012-05-01 MED ORDER — PANTOPRAZOLE SODIUM 40 MG PO TBEC
40.0000 mg | DELAYED_RELEASE_TABLET | Freq: Every day | ORAL | Status: DC
  Administered 2012-05-02: 40 mg via ORAL
  Filled 2012-05-01: qty 1

## 2012-05-01 MED ORDER — WARFARIN - PHARMACIST DOSING INPATIENT
Freq: Every day | Status: DC
  Administered 2012-05-01: 18:00:00

## 2012-05-01 MED ORDER — ALPRAZOLAM 0.25 MG PO TABS: 0.2500 mg | ORAL_TABLET | Freq: Two times a day (BID) | ORAL | Status: DC | PRN

## 2012-05-01 MED ORDER — ONDANSETRON HCL 4 MG/2ML IJ SOLN: 4.0000 mg | Freq: Four times a day (QID) | INTRAMUSCULAR | Status: DC | PRN

## 2012-05-01 MED ORDER — ALLOPURINOL 300 MG PO TABS
300.0000 mg | ORAL_TABLET | Freq: Every day | ORAL | Status: DC
  Administered 2012-05-02 – 2012-05-03 (×2): 300 mg via ORAL
  Filled 2012-05-01 (×2): qty 1

## 2012-05-01 MED ORDER — NITROGLYCERIN 0.4 MG SL SUBL
0.4000 mg | SUBLINGUAL_TABLET | SUBLINGUAL | Status: DC | PRN
  Administered 2012-05-01: 0.4 mg via SUBLINGUAL
  Filled 2012-05-01: qty 25

## 2012-05-01 MED ORDER — LOSARTAN POTASSIUM-HCTZ 100-12.5 MG PO TABS: 1.0000 | ORAL_TABLET | Freq: Every day | ORAL | Status: DC

## 2012-05-01 MED ORDER — SODIUM CHLORIDE 0.9 % IJ SOLN: 3.0000 mL | INTRAMUSCULAR | Status: DC | PRN

## 2012-05-01 MED ORDER — TRAMADOL HCL 50 MG PO TABS
100.0000 mg | ORAL_TABLET | Freq: Four times a day (QID) | ORAL | Status: DC
  Administered 2012-05-01 – 2012-05-02 (×6): 100 mg via ORAL
  Filled 2012-05-01 (×9): qty 2

## 2012-05-01 MED ORDER — SODIUM CHLORIDE 0.9 % IV SOLN: 250.0000 mL | INTRAVENOUS | Status: DC | PRN

## 2012-05-01 MED ORDER — SIMVASTATIN 40 MG PO TABS
40.0000 mg | ORAL_TABLET | Freq: Every evening | ORAL | Status: DC
  Administered 2012-05-01 – 2012-05-02 (×2): 40 mg via ORAL
  Filled 2012-05-01 (×4): qty 1

## 2012-05-01 MED ORDER — NITROGLYCERIN 0.4 MG SL SUBL: 0.4000 mg | SUBLINGUAL_TABLET | SUBLINGUAL | Status: DC | PRN

## 2012-05-01 MED ORDER — INSULIN ASPART PROT & ASPART (70-30 MIX) 100 UNIT/ML ~~LOC~~ SUSP: 20.0000 [IU] | Freq: Three times a day (TID) | SUBCUTANEOUS | Status: DC

## 2012-05-01 MED ORDER — INSULIN ASPART 100 UNIT/ML ~~LOC~~ SOLN
20.0000 [IU] | Freq: Three times a day (TID) | SUBCUTANEOUS | Status: DC
Start: 2012-05-02 — End: 2012-05-01

## 2012-05-01 MED ORDER — ASPIRIN 81 MG PO CHEW
324.0000 mg | CHEWABLE_TABLET | Freq: Once | ORAL | Status: AC
  Administered 2012-05-01: 324 mg via ORAL
  Filled 2012-05-01: qty 4

## 2012-05-01 NOTE — ED Notes (Signed)
Patient transported to radiology on cardiac monitor and oxygen. Tolerated well.

## 2012-05-01 NOTE — H&P (Signed)
History and Physical   Patient ID: Alan Carey MRN: [id number], DOB/AGE: October 11, 1940 71 y.o. Date of Encounter: 05/01/2012  Primary Physician: Dr Synetta [facility] Primary Cardiologist: Dr Deno Etienne with [facility]  Chief Complaint:  Back pain  HPI: [name] is a 71 year old male with a history of atrial arrhythmia (pt not sure if fibrillation or flutter) maintained on warfarin. He developed back pain primarily localized to the mid back. The pain began insidiously a few days ago and has been progressive. Per ER records, he went to [facility] and rec'd pain meds, but the pain continued and intensified. He does not think it got worse with deep inspiration. It was not exertional. There was no relationship to movement of the trunk or upper extremities.  There was no apparent precipitating injury.  The pain was severe 8/10, his wife could not even touch his back without extreme pain. He went to [facility] and, when asked, could not remember the names of his physicians or where he had been seen in the past. His back discomfort did not respond to 4 mg of morphine, but did improve after SL nitroglycerin. He and his wife were asked their preference and requested [facility] instead of [facility], so he was sent here.  Currently, he is resting comfortably. He does not recall prior episodes of these symptoms, but has undergone lumbar surgery in the remote past. He remembers the names of his chronic pain meds but does not know the dosages and cannot remember his other meds.Lifestyle is sedentary, but denies a history of exertional chest pain.  His wife reports that he experiences class 2-3 exertional dyspnea. He is not aware of his irregular heart rate and denies any history of lightheadedness, presyncope, syncope or falls.  He was reassured that we will be able to get any information from his regular physicians and he can follow up with them after discharge.  Past  Medical History  Diagnosis Date  . Arthritis   . Gout   . Diabetes mellitus   . Hypertension   . High cholesterol   . Atrial fibrillation     was placed on coumadin     Surgical History:  Past Surgical History  Procedure Date  . Kidney stone surgery   . Thyroid surgery   . Kidney surgery   . Shoulder debridement     L and R  . Prostate surgery     had prostate cancer approx. 3 yrs ago    I have reviewed the patient's current medications. Medication Sig  . allopurinol (ZYLOPRIM) 300 MG tablet Take 300 mg by mouth daily.  . chlorthalidone (HYGROTON) 25 MG tablet Take 25 mg by mouth daily.  . fenofibrate 160 MG tablet Take 160 mg by mouth daily.  . insulin glargine (LANTUS) 100 UNIT/ML injection Inject 70 Units into the skin at bedtime.   . insulin lispro (HUMALOG) 100 UNIT/ML injection Inject 20 Units into the skin 3 (three) times daily before meals.   Marland Kitchen losartan-hydrochlorothiazide  100-12.5 MG per tablet Take 1 tablet by mouth daily.  . metoprolol (LOPRESSOR) 100 MG tablet Take 50 mg by mouth 2 (two) times daily.  . pregabalin (LYRICA) 100 MG capsule Take 200 mg by mouth 3 (three) times daily.   Marland Kitchen warfarin (COUMADIN) 5 MG tablet Take 7.5-10 mg by mouth daily. Take 10 MG on Mondays and Tuesdays; on Wednesday, Thursday, Friday, Saturday, and Sunday, take 7.5 MG  . DISCONTD: metFORMIN 1000 MG  tablet Take 1,000 mg by mouth 2 times daily with a meal.  . DISCONTD: omeprazole (PRILOSEC) 40 MG capsule Take 40 mg by mouth daily.  Marland Kitchen DISCONTD: simvastatin (ZOCOR) 10 MG tablet Take 10 mg by mouth at bedtime.    Scheduled Meds:   . aspirin  324 mg Oral Once  .  morphine injection  4 mg Intravenous Once  . nitroGLYCERIN  2-200 mcg/min Intravenous Once  . ondansetron  4 mg Intravenous Once    Allergies  Allergen Reactions  . Penicillins Itching and Rash    History   Social History  . Marital Status: Married    Spouse Name: N/A    Number of Children: N/A  . Years of Education:  N/A   Occupational History  . Retired Art therapist of [employer]    Social History Main Topics  . Smoking status: Former Games developer  . Smokeless tobacco: Never Used   Comment: Quit 35 years ago  . Alcohol Use: No  . Drug Use: No  . Sexually Active: Not on file   Other Topics Concern  . Not on file   Social History Narrative   Originally from [place], lived in [place] 43 years.   Review of Systems: Pt told [facility] staff he had a stress test at some point in the past and thinks it was OK, no further details available. His stomach does not give him trouble. He denies any bleeding history. No recent illnesses, fevers or chills. Full 14-point review of systems otherwise negative except as noted above.  Physical Exam: Blood pressure 119/86, pulse 65, temperature 97.7 F (36.5 C), temperature source Oral, resp. rate 13, height 6' (1.829 m), weight 251 lb 1.7 oz (113.9 kg), SpO2 99.00%. General: Well developed, well nourished, moderately overweight male in no acute distress. Head: Normocephalic, atraumatic, sclera non-icteric, no xanthomas, nares are without discharge. Dentition: poor Neck: No carotid bruits. JVD mildly elevated. No thyromegally Lungs: Good expansion bilaterally. without wheezes or rhonchi. Rales bases, no crackles Heart: IRRegular rate and rhythm with S1 S2.  No S3 or S4.  No murmur, no rubs, or gallops appreciated. Abdomen: Soft, non-tender, non-distended with normoactive bowel sounds. No hepatomegaly. No rebound/guarding. No obvious abdominal masses.  Msk:  Strength and tone appear normal for age. Chronic joint deformities, no joint effusions, no spine or costo-vertebral angle tenderness. Extremities: No clubbing or cyanosis. No edema.  Distal pedal pulses are 2+ in 4 extrem Neuro: Alert and oriented X 3. Moves all extremities spontaneously. No focal deficits noted. Psych:  Responds to questions appropriately with a normal affect. Skin: No rashes or lesions  noted  Labs:   Lab Results  Component Value Date   WBC 7.8 31-May-2012   HGB 14.6 31-May-2012   HCT 45.1 May 31, 2012   MCV 82.6 2012/05/31   PLT 146* 05/31/2012    Basename May 31, 2012 1115  INR 1.99*    Lab 31-May-2012 1115  NA 142  K 3.6  CL 101  CO2 29  BUN 22  CREATININE 0.90  CALCIUM 9.3  PROT 7.0  BILITOT 0.5  ALKPHOS 62  ALT 16  AST 18  GLUCOSE 178*    Basename 05-31-12 1115  CKTOTAL --  CKMB --  TROPONINI <0.30   Pro B Natriuretic peptide (BNP)  Date/Time Value Range Status  05-31-2012 11:15 AM 524.1* 0 - 125 pg/mL Final   Radiology/Studies: Ct Angio Abdomen +Chest May 31, 2012: mild ectasia of ascending aorta; mild to moderate cardiomegaly; possible prior anterior MI with coronary  artery calcifications; small hiatal hernia; 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  Mild ASVD of the abdominal aorta; multilevel DDD of LS spine; nephrolithiasis in the renal pelvis bilaterally; prior cholecystectomy; renal cysts; duodenal diverticulum   ECG: 01-May-2012 11:01:11  atrial flutter with variable A-V block and ventricular rate of 40 bpm; slightly delayed R wave progression; NSST & T wave abnormality  ASSESSMENT AND PLAN:    *Back pain - low likelihood that etiology is myocardial ischemia. cycle enzymes, D/C IV NTG and follow symptoms. MD advise about further eval.   Atrial flutter - slow VR, d/c metoprolol and allow HR to come up. Further eval and Rx based on response to holding med.    Hypertension - MD advise med changes for better control.  Chronic anticoag - continue coumadin.   DM - continue Lantus with SSI.  Rhonda Barrett PA-C 05/01/2012, 3:26 PM  Cardiology Attending Patient interviewed and examined. Discussed with Theodore Demark, PA-C.  Above note annotated and modified based upon my findings.  Pt seen for back pain that does not have the characteristics of angina or an acute ischemic syndrome.  ED physician concerned that NTG appeared to relieve pain, but this is not  particularly helpful in establishing etiology.  More likely based upon history is a musculoskeletal etiology or passage of a renal calculus.  IV NTG to be discontinued.  Serial cardiac markers ordered.  If patient continues asymptomatic, markers prove normal and HR increases, he can be discharged in the AM to follow with his [facility].  [place] Bing, MD 05/01/2012, 11:34 PM

## 2012-05-01 NOTE — Progress Notes (Signed)
ANTICOAGULATION CONSULT NOTE - Initial Consult  Pharmacy Consult for coumadin Indication: atrial fibrillation  Allergies  Allergen Reactions  . Penicillins Itching and Rash    Patient Measurements: Height: 6' (182.9 cm) Weight: 251 lb 1.7 oz (113.9 kg) IBW/kg (Calculated) : 77.6   Vital Signs: Temp: 97.7 F (36.5 C) (09/08 1445) Temp src: Oral (09/08 1445) BP: 119/86 mmHg (09/08 1500) Pulse Rate: 65  (09/08 1500)  Labs:  Basename 05/01/12 1115  HGB 14.6  HCT 45.1  PLT 146*  APTT --  LABPROT 22.9*  INR 1.99*  HEPARINUNFRC --  CREATININE 0.90  CKTOTAL --  CKMB --  TROPONINI <0.30    Estimated Creatinine Clearance: 99.5 ml/min (by C-G formula based on Cr of 0.9).   Medical History: Past Medical History  Diagnosis Date  . Arthritis   . Gout   . Diabetes mellitus   . Hypertension   . High cholesterol   . Atrial fibrillation     was placed on coumadin   . Diabetes mellitus   . Chronic anticoagulation   . Cancer     Assessment: 71 yo male with history of afib here for back pain. Patient to continue coumadin during admission (INR=1.99; home dose 10 mg Mon & Tues, 7.5 mg all other days)  Goal of Therapy:  INR 2-3 Monitor platelets by anticoagulation protocol: Yes   Plan:  -Coumadin 7.5mg  today based on home regimen -PT/INR daily for now  Harland German, Pharm D 05/01/2012 5:55 PM

## 2012-05-01 NOTE — ED Notes (Signed)
Pt having upper back pain for two days.  Was seen at [facility] yesterday given pain medication and took xray.  Pt states pain is continuing.  No known injury.

## 2012-05-02 LAB — URINALYSIS, ROUTINE W REFLEX MICROSCOPIC
Glucose, UA: NEGATIVE mg/dL
Leukocytes, UA: NEGATIVE
Nitrite: NEGATIVE
Protein, ur: NEGATIVE mg/dL
Urobilinogen, UA: 1 mg/dL (ref 0.0–1.0)

## 2012-05-02 LAB — PROTIME-INR: Prothrombin Time: 35.7 seconds — ABNORMAL HIGH (ref 11.6–15.2)

## 2012-05-02 LAB — TSH: TSH: 0.868 u[IU]/mL (ref 0.350–4.500)

## 2012-05-02 LAB — GLUCOSE, CAPILLARY: Glucose-Capillary: 211 mg/dL — ABNORMAL HIGH (ref 70–99)

## 2012-05-02 MED ORDER — MORPHINE SULFATE 2 MG/ML IJ SOLN
INTRAMUSCULAR | Status: AC
  Filled 2012-05-02: qty 1

## 2012-05-02 MED ORDER — MORPHINE SULFATE 2 MG/ML IJ SOLN
2.0000 mg | INTRAMUSCULAR | Status: DC | PRN
  Administered 2012-05-02 (×3): 2 mg via INTRAVENOUS
  Filled 2012-05-02 (×2): qty 1

## 2012-05-02 NOTE — Progress Notes (Signed)
Notified Md pt c/o pain in lower back. Given other PRN medications without relief.  Hr 34-42 BP 141/66.  New orders received.  Educated pt to call RN if need bathroom assistance.  Will continue to monitor.  Alan Carey

## 2012-05-02 NOTE — Progress Notes (Signed)
Patient ID: Alan Carey, male   DOB: Sep 20, 1940, 71 y.o.   MRN: [id number]    Subjective:  Denies SSCP, palpitations or Dyspnea Back pain is better Sees [facility]  Objective:  Filed Vitals:   05/02/12 0104 05/02/12 0130 05/02/12 0140 05/02/12 0325  BP: 141/66   128/58  Pulse:      Temp:    97.4 F (36.3 C)  TempSrc:    Oral  Resp:      Height:      Weight:    246 lb 14.6 oz (112 kg)  SpO2:  93% 90% 96%    Intake/Output from previous day:  Intake/Output Summary (Last 24 hours) at 05/02/12 0805 Last data filed at 05/01/12 2300  Gross per 24 hour  Intake    420 ml  Output    300 ml  Net    120 ml    Physical Exam: Affect appropriate Healthy:  appears stated age HEENT: normal Neck supple with no adenopathy JVP normal no bruits no thyromegaly Lungs clear with no wheezing and good diaphragmatic motion Heart:  S1/S2 no murmur, no rub, gallop or click PMI normal Abdomen: benighn, BS positve, no tenderness, no AAA no bruit.  No HSM or HJR Distal pulses intact with no bruits No edema Neuro non-focal Skin warm and dry No muscular weakness   Lab Results: Basic Metabolic Panel:  Basename 05/01/12 1115  NA 142  K 3.6  CL 101  CO2 29  GLUCOSE 178*  BUN 22  CREATININE 0.90  CALCIUM 9.3  MG --  PHOS --   Liver Function Tests:  Basename 05/01/12 1115  AST 18  ALT 16  ALKPHOS 62  BILITOT 0.5  PROT 7.0  ALBUMIN 3.9   No results found for this basename: LIPASE:2,AMYLASE:2 in the last 72 hours CBC:  Basename 05/01/12 1115  WBC 7.8  NEUTROABS 4.1  HGB 14.6  HCT 45.1  MCV 82.6  PLT 146*   Cardiac Enzymes:  Basename 05/01/12 1115  CKTOTAL --  CKMB --  CKMBINDEX --  TROPONINI <0.30    Imaging: Ct Angio Pelvis W/cm &/or Wo/cm  05/01/2012  *RADIOLOGY REPORT*  Clinical Data:  Some onset of upper back pain.  Evaluate for potential aortic dissection.  CT ANGIOGRAPHY CHEST, ABDOMEN AND PELVIS  Technique:  Multidetector CT imaging  through the chest, abdomen and pelvis was performed using the standard protocol during bolus administration of intravenous contrast.  Multiplanar reconstructed images including MIPs were obtained and reviewed to evaluate the vascular anatomy.  Contrast: OMNIPAQUE IOHEXOL 350 MG/ML SOLN,  Comparison:  CT of abdomen and pelvis 11/05/2011.  CTA CHEST  Findings:  Mediastinum: On precontrast images, there is no crescentic high attenuation associated with the wall of the thoracic aorta to suggest the presence of acute intramural hemorrhage.  Post contrast images demonstrate no aneurysm, dissection or penetrating aortic ulcer associated with the thoracic aorta.  Ascending thoracic aorta is mildly ectatic measuring 4.2 cm.  The arch measures 2.8 cm, and the descending thoracic aorta measures 2.8 cm in diameter as well. Heart size is  enlarged. There is no significant pericardial fluid, thickening or pericardial calcification. There is atherosclerosis of the thoracic aorta, the great vessels of the mediastinum and the coronary arteries, including calcified atherosclerotic plaque in the left anterior descending, left circumflex and right coronary arteries.  Mild thinning of the left ventricular myocardium in the region of the mid ventricular anteroseptal wall segment.  Calcified mediastinal and right hilar lymph nodes are  incidentally noted. No pathologically enlarged mediastinal or hilar lymph nodes. There is a small hiatal hernia.  The left lobe of the thyroid gland is slightly enlarged and heterogeneous in appearance.  Lungs/Pleura: Small area of subsegmental atelectasis or scarring in the lateral segment of the right middle lobe.  The no acute consolidative air space disease.  No pleural effusions.  The no definite suspicious appearing pulmonary nodules or masses are identified.  Small area of pleural-based scarring in the left apex.  Musculoskeletal: There are no aggressive appearing lytic or blastic lesions noted  in the visualized portions of the skeleton.   Review of the MIP images confirms the above findings.  IMPRESSION: 1.  While there is very mild ectasia of the ascending thoracic aorta (4.2 cm in diameter), there are no findings to suggest acute aortic syndrome at this time.  2.  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  There is atherosclerosis, including at least three vessel coronary artery disease.  Assessment for potential risk factor modification, dietary therapy or pharmacologic therapy may be warranted, if clinically indicated. 3.  Small hiatal hernia. 4. The left lobe of the thyroid gland is slightly enlarged and heterogeneous in appearance.  This is nonspecific, and likely reflects a goiter.  However, if there is clinical concern for thyroid disease or neoplasm, further evaluation with non emergent thyroid ultrasound may be warranted. 5.  Additional incidental findings, as above.  CTA ABDOMEN AND PELVIS  Findings:  Abdomen/Pelvis:  Precontrast images demonstrate no crescentic areas of high attenuation associated with the wall of the abdominal aorta to suggest acute intramural hemorrhage.  There is mild atherosclerosis of the abdominal and pelvic vasculature, without definite aneurysm or dissection.  The celiac axis, superior mesenteric artery and inferior mesenteric artery and their major branches are all widely patent.  There are several small nonobstructive calculi within the collecting systems of the kidneys bilaterally, largest of which measures up to 6 mm in the lower pole of the right kidney.  No ureteral calculi or findings to suggest urinary tract obstruction at this time.  Circumaortic left renal vein (a normal anatomical variant) is incidentally noted.  The left kidney has a altered axis (normal variant), which is unlikely to be related to any  acute symptomatology.  Multiple low attenuation lesions in the kidneys bilaterally, the largest of which measures the largest of which measures 2.0 x 1.6 cm in the upper pole of the left kidney.  The larger of these lesions are all compatible with simple cysts, while the smaller lesions are too small to definitively characterize.  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  Status post cholecystectomy.  Small calcification in the liver likely represents a calcified granuloma.  No other focal hepatic lesions are identified.  The appearance of the pancreas, spleen and bilateral adrenal glands is unremarkable.  There are a few scattered colonic diverticula, without surrounding inflammatory changes to suggest an acute diverticulitis at this time.  No ascites or pneumoperitoneum and no pathologic distension of bowel. No definite pathologic lymphadenopathy identified within the abdomen or pelvis.  Urinary bladder is unremarkable in appearance.  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  Additionally, at the L4-L5 level there is vacuum disc phenomenon, and there appears to be a large right sided lateral disc  extrusion (with some vacuum disc phenomenon) extending into the medial aspect of the right psoas muscle.  A well-defined sclerotic lesion in the left ilium is favored to represent a small bone island.  No other aggressive appearing lytic or blastic lesions are noted in the visualized portions of the skeleton.   Review of the MIP images confirms the above findings.  IMPRESSION: 1. Mild atherosclerosis, but no acute findings of the abdominal aorta to account for the patient's symptoms. 2.  There is multilevel degenerative disc disease and lumbar spondylosis, including vacuum disc phenomenon at the level of L4- L5, part of which extrudes into the medial aspect of the right psoas  muscle.  This is new compared to prior examination from 11/05/2011, but the relationship of this finding to the patient's acute presentation is uncertain. 3.  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 4.  Status post cholecystectomy. 5.  Low attenuation renal lesions bilaterally, the largest of which are compatible with a simple cyst, while the smaller lesions are too small to definitively characterize. 6.  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 7.  Status post cholecystectomy. 8.  Additional incidental findings, as above.   Original Report Authenticated By: Florencia Reasons, M.D.    Ct Angio Abdomen W/cm &/or Wo Contrast  05/01/2012  *RADIOLOGY REPORT*  Clinical Data:  Some onset of upper back pain.  Evaluate for potential aortic dissection.  CT ANGIOGRAPHY CHEST, ABDOMEN AND PELVIS  Technique:  Multidetector CT imaging through the chest, abdomen and pelvis was performed using the standard protocol during bolus administration of intravenous contrast.  Multiplanar reconstructed images including MIPs were obtained and reviewed to evaluate the vascular anatomy.  Contrast: OMNIPAQUE IOHEXOL 350 MG/ML SOLN,  Comparison:  CT of abdomen and pelvis 11/05/2011.  CTA CHEST  Findings:  Mediastinum: On precontrast images, there is no crescentic high attenuation associated with the wall of the thoracic aorta to suggest the presence of acute intramural hemorrhage.  Post contrast images demonstrate no aneurysm, dissection or penetrating aortic ulcer associated with the thoracic aorta.  Ascending thoracic aorta is mildly ectatic measuring 4.2 cm.  The arch measures 2.8 cm, and the descending thoracic aorta measures 2.8 cm in diameter as well. Heart size is  enlarged. There is no significant pericardial fluid, thickening or pericardial calcification. There is atherosclerosis of the thoracic aorta, the great vessels of the  mediastinum and the coronary arteries, including calcified atherosclerotic plaque in the left anterior descending, left circumflex and right coronary arteries.  Mild thinning of the left ventricular myocardium in the region of the mid ventricular anteroseptal wall segment.  Calcified mediastinal and right hilar lymph nodes are incidentally noted. No pathologically enlarged mediastinal or hilar lymph nodes. There is a small hiatal hernia.  The left lobe of the thyroid gland is slightly enlarged and heterogeneous in appearance.  Lungs/Pleura: Small area of subsegmental atelectasis or scarring in the lateral segment of the right middle lobe.  The no acute consolidative air space disease.  No pleural effusions.  The no definite suspicious appearing pulmonary nodules or masses are identified.  Small area of pleural-based scarring in the left apex.  Musculoskeletal: There are no aggressive appearing lytic or blastic lesions noted in the visualized portions of the skeleton.   Review of the MIP images confirms the above findings.  IMPRESSION: 1.  While there is very mild ectasia of the ascending thoracic aorta (4.2 cm in diameter), there are  no findings to suggest acute aortic syndrome at this time.  2.  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  There is atherosclerosis, including at least three vessel coronary artery disease.  Assessment for potential risk factor modification, dietary therapy or pharmacologic therapy may be warranted, if clinically indicated. 3.  Small hiatal hernia. 4. The left lobe of the thyroid gland is slightly enlarged and heterogeneous in appearance.  This is nonspecific, and likely reflects a goiter.  However, if there is clinical concern for thyroid disease or neoplasm, further evaluation with non emergent thyroid ultrasound may be warranted. 5.   Additional incidental findings, as above.  CTA ABDOMEN AND PELVIS  Findings:  Abdomen/Pelvis:  Precontrast images demonstrate no crescentic areas of high attenuation associated with the wall of the abdominal aorta to suggest acute intramural hemorrhage.  There is mild atherosclerosis of the abdominal and pelvic vasculature, without definite aneurysm or dissection.  The celiac axis, superior mesenteric artery and inferior mesenteric artery and their major branches are all widely patent.  There are several small nonobstructive calculi within the collecting systems of the kidneys bilaterally, largest of which measures up to 6 mm in the lower pole of the right kidney.  No ureteral calculi or findings to suggest urinary tract obstruction at this time.  Circumaortic left renal vein (a normal anatomical variant) is incidentally noted.  The left kidney has a altered axis (normal variant), which is unlikely to be related to any acute symptomatology.  Multiple low attenuation lesions in the kidneys bilaterally, the largest of which measures the largest of which measures 2.0 x 1.6 cm in the upper pole of the left kidney.  The larger of these lesions are all compatible with simple cysts, while the smaller lesions are too small to definitively characterize.  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  Status post cholecystectomy.  Small calcification in the liver likely represents a calcified granuloma.  No other focal hepatic lesions are identified.  The appearance of the pancreas, spleen and bilateral adrenal glands is unremarkable.  There are a few scattered colonic diverticula, without surrounding inflammatory changes to suggest an acute diverticulitis at this time.  No ascites or pneumoperitoneum and no pathologic distension of bowel. No definite pathologic lymphadenopathy identified within the abdomen or pelvis.  Urinary bladder is unremarkable in appearance.  Musculoskeletal:  There is a severe multilevel degenerative disc disease and lumbar spondylosis, which is most pronounced at the L4- L5.  Additionally, at the L4-L5 level there is vacuum disc phenomenon, and there appears to be a large right sided lateral disc extrusion (with some vacuum disc phenomenon) extending into the medial aspect of the right psoas muscle.  A well-defined sclerotic lesion in the left ilium is favored to represent a small bone island.  No other aggressive appearing lytic or blastic lesions are noted in the visualized portions of the skeleton.   Review of the MIP images confirms the above findings.  IMPRESSION: 1. Mild atherosclerosis, but no acute findings of the abdominal aorta to account for the patient's symptoms. 2.  There is multilevel degenerative disc disease and lumbar spondylosis, including vacuum disc phenomenon at the level of L4- L5, part of which extrudes into the medial aspect of the right psoas muscle.  This is new compared to prior examination from 11/05/2011, but the relationship of this finding to the patient's  acute presentation is uncertain. 3.  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 4.  Status post cholecystectomy. 5.  Low attenuation renal lesions bilaterally, the largest of which are compatible with a simple cyst, while the smaller lesions are too small to definitively characterize. 6.  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 7.  Status post cholecystectomy. 8.  Additional incidental findings, as above.   Original Report Authenticated By: Florencia Reasons, M.D.    Dg Chest Port 1 View  05/01/2012  *RADIOLOGY REPORT*  Clinical Data: Pain.  PORTABLE CHEST - 1 VIEW  Comparison: No priors.  Findings: Study is limited by very lordotic positioning and rotation to the right.  As well, the left costophrenic sulcus is incompletely imaged.  Lung volumes are normal.  No definite consolidative airspace  disease.  No obvious pleural effusion (a small left pleural effusion would be difficult to exclude).  Mild- moderate cardiomegaly. The patient is rotated to the right on today's exam, resulting in distortion of the mediastinal contours and reduced diagnostic sensitivity and specificity for mediastinal pathology.  The thoracic aorta appears mildly tortuous.  IMPRESSION: 1.  Given the limitations of this examination, accurate assessment for mediastinal widening suggestive of aortic dissection is not possible secondary to lordotic positioning and rotation.  If there is strong clinical concern for dissection, a repeat PA lateral chest radiograph, or CTA of the thorax could be obtained. 2.  Mild-moderate cardiomegaly.   Original Report Authenticated By: Florencia Reasons, M.D.    Ct Angio Chest Aorta W/cm &/or Wo/cm  05/01/2012  *RADIOLOGY REPORT*  Clinical Data:  Some onset of upper back pain.  Evaluate for potential aortic dissection.  CT ANGIOGRAPHY CHEST, ABDOMEN AND PELVIS  Technique:  Multidetector CT imaging through the chest, abdomen and pelvis was performed using the standard protocol during bolus administration of intravenous contrast.  Multiplanar reconstructed images including MIPs were obtained and reviewed to evaluate the vascular anatomy.  Contrast: OMNIPAQUE IOHEXOL 350 MG/ML SOLN,  Comparison:  CT of abdomen and pelvis 11/05/2011.  CTA CHEST  Findings:  Mediastinum: On precontrast images, there is no crescentic high attenuation associated with the wall of the thoracic aorta to suggest the presence of acute intramural hemorrhage.  Post contrast images demonstrate no aneurysm, dissection or penetrating aortic ulcer associated with the thoracic aorta.  Ascending thoracic aorta is mildly ectatic measuring 4.2 cm.  The arch measures 2.8 cm, and the descending thoracic aorta measures 2.8 cm in diameter as well. Heart size is  enlarged. There is no significant pericardial fluid, thickening or  pericardial calcification. There is atherosclerosis of the thoracic aorta, the great vessels of the mediastinum and the coronary arteries, including calcified atherosclerotic plaque in the left anterior descending, left circumflex and right coronary arteries.  Mild thinning of the left ventricular myocardium in the region of the mid ventricular anteroseptal wall segment.  Calcified mediastinal and right hilar lymph nodes are incidentally noted. No pathologically enlarged mediastinal or hilar lymph nodes. There is a small hiatal hernia.  The left lobe of the thyroid gland is slightly enlarged and heterogeneous in appearance.  Lungs/Pleura: Small area of subsegmental atelectasis or scarring in the lateral segment of the right middle lobe.  The no acute consolidative air space disease.  No pleural effusions.  The no definite suspicious appearing pulmonary nodules or masses are identified.  Small area of pleural-based scarring in the left apex.  Musculoskeletal: There are no aggressive  appearing lytic or blastic lesions noted in the visualized portions of the skeleton.   Review of the MIP images confirms the above findings.  IMPRESSION: 1.  While there is very mild ectasia of the ascending thoracic aorta (4.2 cm in diameter), there are no findings to suggest acute aortic syndrome at this time.  2.  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  There is atherosclerosis, including at least three vessel coronary artery disease.  Assessment for potential risk factor modification, dietary therapy or pharmacologic therapy may be warranted, if clinically indicated. 3.  Small hiatal hernia. 4. The left lobe of the thyroid gland is slightly enlarged and heterogeneous in appearance.  This is nonspecific, and likely reflects a goiter.  However, if there is clinical concern for  thyroid disease or neoplasm, further evaluation with non emergent thyroid ultrasound may be warranted. 5.  Additional incidental findings, as above.  CTA ABDOMEN AND PELVIS  Findings:  Abdomen/Pelvis:  Precontrast images demonstrate no crescentic areas of high attenuation associated with the wall of the abdominal aorta to suggest acute intramural hemorrhage.  There is mild atherosclerosis of the abdominal and pelvic vasculature, without definite aneurysm or dissection.  The celiac axis, superior mesenteric artery and inferior mesenteric artery and their major branches are all widely patent.  There are several small nonobstructive calculi within the collecting systems of the kidneys bilaterally, largest of which measures up to 6 mm in the lower pole of the right kidney.  No ureteral calculi or findings to suggest urinary tract obstruction at this time.  Circumaortic left renal vein (a normal anatomical variant) is incidentally noted.  The left kidney has a altered axis (normal variant), which is unlikely to be related to any acute symptomatology.  Multiple low attenuation lesions in the kidneys bilaterally, the largest of which measures the largest of which measures 2.0 x 1.6 cm in the upper pole of the left kidney.  The larger of these lesions are all compatible with simple cysts, while the smaller lesions are too small to definitively characterize.  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  Status post cholecystectomy.  Small calcification in the liver likely represents a calcified granuloma.  No other focal hepatic lesions are identified.  The appearance of the pancreas, spleen and bilateral adrenal glands is unremarkable.  There are a few scattered colonic diverticula, without surrounding inflammatory changes to suggest an acute diverticulitis at this time.  No ascites or pneumoperitoneum and no pathologic distension of bowel. No definite pathologic lymphadenopathy  identified within the abdomen or pelvis.  Urinary bladder is unremarkable in appearance.  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  Additionally, at the L4-L5 level there is vacuum disc phenomenon, and there appears to be a large right sided lateral disc extrusion (with some vacuum disc phenomenon) extending into the medial aspect of the right psoas muscle.  A well-defined sclerotic lesion in the left ilium is favored to represent a small bone island.  No other aggressive appearing lytic or blastic lesions are noted in the visualized portions of the skeleton.   Review of the MIP images confirms the above findings.  IMPRESSION: 1. Mild atherosclerosis, but no acute findings of the abdominal aorta to account for the patient's symptoms. 2.  There is multilevel degenerative disc disease  and lumbar spondylosis, including vacuum disc phenomenon at the level of L4- L5, part of which extrudes into the medial aspect of the right psoas muscle.  This is new compared to prior examination from 11/05/2011, but the relationship of this finding to the patient's acute presentation is uncertain. 3.  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 4.  Status post cholecystectomy. 5.  Low attenuation renal lesions bilaterally, the largest of which are compatible with a simple cyst, while the smaller lesions are too small to definitively characterize. 6.  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 7.  Status post cholecystectomy. 8.  Additional incidental findings, as above.   Original Report Authenticated By: Florencia Reasons, M.D.     Cardiac Studies:  ECG:  Atrial flutter slow rate 32    Telemetry: Slow atrial flutter 05/02/2012   Echo:   Medications:     . allopurinol  300 mg Oral Daily  . aspirin  324 mg Oral Once  . chlorthalidone  25 mg  Oral Daily  . fenofibrate  160 mg Oral Daily  . insulin aspart  0-15 Units Subcutaneous TID WC  . insulin glargine  70 Units Subcutaneous QHS  . losartan  100 mg Oral Daily  . morphine      .  morphine injection  4 mg Intravenous Once  . nitroGLYCERIN  2-200 mcg/min Intravenous Once  . ondansetron  4 mg Intravenous Once  . pantoprazole  40 mg Oral Q1200  . pregabalin  200 mg Oral TID  . simvastatin  40 mg Oral QPM  . sodium chloride  3 mL Intravenous Q12H  . traMADol  100 mg Oral QID  . warfarin  7.5 mg Oral ONCE-1800  . Warfarin - Pharmacist Dosing Inpatient   Does not apply q1800  . DISCONTD: hydrochlorothiazide  12.5 mg Oral Daily  . DISCONTD: insulin aspart  20 Units Subcutaneous TID AC  . DISCONTD: insulin aspart protamine-insulin aspart  20 Units Subcutaneous TID WC  . DISCONTD: losartan-hydrochlorothiazide  1 tablet Oral Daily       Assessment/Plan:  Back Pain: Improved CT with no aneurysm and multiple degenerative disc disease with previous lumbar surgery Flutter:  No coumadin today as INR over 3 Rate slow off AV nodal blocking drugs.  Was on lopresser 50 bid before admission BS:  Continue pregabalin and check FBS  Will transfer to telemetry  D/C in am if HR improves  F/U [facility] cardiology  Charlton Haws 05/02/2012, 8:05 AM

## 2012-05-02 NOTE — ED Provider Notes (Addendum)
History     CSN: [id number]  Arrival date & time 05/01/12  1020   First MD Initiated Contact with Patient 05/01/12 1053      Chief Complaint  Patient presents with  . Back Pain    (Consider location/radiation/quality/duration/timing/severity/associated sxs/prior treatment) HPI Patient is a 71 yo male who presents today complaining of 8/10 upper back pain between his shoulder blades with increasing fatigue noted over the past 4-5 days.  He was seen by his PCP, Dr. Blenda Nicely at [facility] yesterday and had xray that was negative.  Patient denies injury and feels somewhat short of breath.  He denies fever or cough.  Patient was notably bradycardic in the 40s with this upon arrival.  He also had EKG with atrial flutter with variable block.  Patient has history of afib and is on coumadin for this.  When asked about cardiology history patient reports possible stress test an unknown time ago at an unknown cardiologist's office.  When pressed further patient and wife both can not recall who the patient saw or where they were seen and they state "patient was just seen once for the a-fib".  Patient denies focal weakness, numbness, or chest pain.  He appears very anxious and did not take ASA prior to arrival.  He has DM, HTN, HLD, and no definite h/o CAD.  No tobacco use in the past.  Nothing has made his symptoms better or worse.  There is no radiation of his pain.  Patient is difficult to illicit history from secondary to pain and can not characterize pain further. Past Medical History  Diagnosis Date  . Arthritis   . Gout   . Diabetes mellitus   . Hypertension   . High cholesterol   . Atrial fibrillation     was placed on coumadin   . Diabetes mellitus   . Chronic anticoagulation   . Cancer     Past Surgical History  Procedure Date  . Kidney stone surgery   . Thyroid surgery   . Kidney surgery   . Shoulder debridement     L and R  . Prostate surgery     had prostate cancer approx. 3  yrs ago  . Total knee arthroplasty     right  . Back surgery   . Joint replacement     History reviewed. No pertinent family history.  History  Substance Use Topics  . Smoking status: Former Games developer  . Smokeless tobacco: Never Used   Comment: Quit 35 years ago  . Alcohol Use: No      Review of Systems  Constitutional: Positive for fatigue.  HENT: Negative.   Eyes: Negative.   Respiratory: Positive for shortness of breath. Negative for cough.   Cardiovascular: Negative for chest pain, palpitations and leg swelling.  Gastrointestinal: Negative.   Genitourinary: Negative.   Musculoskeletal: Positive for back pain.  Skin: Negative.   Neurological: Positive for weakness.  Hematological: Negative.   Psychiatric/Behavioral: Negative.   All other systems reviewed and are negative.    Allergies  Penicillins  Home Medications  No current outpatient prescriptions on file.  BP 141/66  Pulse 65  Temp 98.4 F (36.9 C) (Oral)  Resp 14  Ht 6' (1.829 m)  Wt 251 lb 1.7 oz (113.9 kg)  BMI 34.06 kg/m2  SpO2 90%  Physical Exam  Nursing note and vitals reviewed. GEN: Well-developed, well-nourished male in mild distress and anxious HEENT: Atraumatic, normocephalic.  EYES: PERRLA BL, no scleral icterus. NECK: Trachea  midline, no meningismus CV: bradycardic with irregular rhythm. No murmurs, rubs, or gallops PULM: No respiratory distress.  No crackles, wheezes, or rales. GI: soft, non-tender. No guarding, rebound, or tenderness. + bowel sounds  GU: deferred Neuro: cranial nerves grossly 2-12 intact, no abnormalities of strength or sensation, A and O x 3 MSK: Patient moves all 4 extremities symmetrically, no deformity, edema, or injury noted Skin: No rashes petechiae, purpura, or jaundice Psych: anxious   ED Course  Procedures (including critical care time)  Indication: bradycardia Please note this EKG was reviewed extemporaneously by myself.   Date: 05/01/2012  Rate:  40  Rhythm: atrial flutter and with variable block  QRS Axis: normal  Intervals: normal  ST/T Wave abnormalities: nonspecific T wave changes  Conduction Disutrbances:variable block noted  Narrative Interpretation: atrial flutter seen previously, rate now slower  Old EKG Reviewed: changes noted         Labs Reviewed  CBC WITH DIFFERENTIAL - Abnormal; Notable for the following:    RDW 17.7 (*)     Platelets 146 (*)     All other components within normal limits  COMPREHENSIVE METABOLIC PANEL - Abnormal; Notable for the following:    Glucose, Bld 178 (*)     GFR calc non Af Amer 84 (*)     All other components within normal limits  PROTIME-INR - Abnormal; Notable for the following:    Prothrombin Time 22.9 (*)     INR 1.99 (*)     All other components within normal limits  PRO B NATRIURETIC PEPTIDE - Abnormal; Notable for the following:    Pro B Natriuretic peptide (BNP) 524.1 (*)     All other components within normal limits  GLUCOSE, CAPILLARY - Abnormal; Notable for the following:    Glucose-Capillary 111 (*)     All other components within normal limits  GLUCOSE, CAPILLARY - Abnormal; Notable for the following:    Glucose-Capillary 130 (*)     All other components within normal limits  TROPONIN I  MRSA PCR SCREENING  PROTIME-INR  URINALYSIS, ROUTINE W REFLEX MICROSCOPIC  TSH   Ct Angio Pelvis W/cm &/or Wo/cm  05/01/2012  *RADIOLOGY REPORT*  Clinical Data:  Some onset of upper back pain.  Evaluate for potential aortic dissection.  CT ANGIOGRAPHY CHEST, ABDOMEN AND PELVIS  Technique:  Multidetector CT imaging through the chest, abdomen and pelvis was performed using the standard protocol during bolus administration of intravenous contrast.  Multiplanar reconstructed images including MIPs were obtained and reviewed to evaluate the vascular anatomy.  Contrast: OMNIPAQUE IOHEXOL 350 MG/ML SOLN,  Comparison:  CT of abdomen and pelvis 11/05/2011.  CTA CHEST  Findings:   Mediastinum: On precontrast images, there is no crescentic high attenuation associated with the wall of the thoracic aorta to suggest the presence of acute intramural hemorrhage.  Post contrast images demonstrate no aneurysm, dissection or penetrating aortic ulcer associated with the thoracic aorta.  Ascending thoracic aorta is mildly ectatic measuring 4.2 cm.  The arch measures 2.8 cm, and the descending thoracic aorta measures 2.8 cm in diameter as well. Heart size is  enlarged. There is no significant pericardial fluid, thickening or pericardial calcification. There is atherosclerosis of the thoracic aorta, the great vessels of the mediastinum and the coronary arteries, including calcified atherosclerotic plaque in the left anterior descending, left circumflex and right coronary arteries.  Mild thinning of the left ventricular myocardium in the region of the mid ventricular anteroseptal wall segment.  Calcified mediastinal and  right hilar lymph nodes are incidentally noted. No pathologically enlarged mediastinal or hilar lymph nodes. There is a small hiatal hernia.  The left lobe of the thyroid gland is slightly enlarged and heterogeneous in appearance.  Lungs/Pleura: Small area of subsegmental atelectasis or scarring in the lateral segment of the right middle lobe.  The no acute consolidative air space disease.  No pleural effusions.  The no definite suspicious appearing pulmonary nodules or masses are identified.  Small area of pleural-based scarring in the left apex.  Musculoskeletal: There are no aggressive appearing lytic or blastic lesions noted in the visualized portions of the skeleton.   Review of the MIP images confirms the above findings.  IMPRESSION: 1.  While there is very mild ectasia of the ascending thoracic aorta (4.2 cm in diameter), there are no findings to suggest acute aortic syndrome at this time.  2.  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  There is atherosclerosis, including at least three vessel coronary artery disease.  Assessment for potential risk factor modification, dietary therapy or pharmacologic therapy may be warranted, if clinically indicated. 3.  Small hiatal hernia. 4. The left lobe of the thyroid gland is slightly enlarged and heterogeneous in appearance.  This is nonspecific, and likely reflects a goiter.  However, if there is clinical concern for thyroid disease or neoplasm, further evaluation with non emergent thyroid ultrasound may be warranted. 5.  Additional incidental findings, as above.  CTA ABDOMEN AND PELVIS  Findings:  Abdomen/Pelvis:  Precontrast images demonstrate no crescentic areas of high attenuation associated with the wall of the abdominal aorta to suggest acute intramural hemorrhage.  There is mild atherosclerosis of the abdominal and pelvic vasculature, without definite aneurysm or dissection.  The celiac axis, superior mesenteric artery and inferior mesenteric artery and their major branches are all widely patent.  There are several small nonobstructive calculi within the collecting systems of the kidneys bilaterally, largest of which measures up to 6 mm in the lower pole of the right kidney.  No ureteral calculi or findings to suggest urinary tract obstruction at this time.  Circumaortic left renal vein (a normal anatomical variant) is incidentally noted.  The left kidney has a altered axis (normal variant), which is unlikely to be related to any acute symptomatology.  Multiple low attenuation lesions in the kidneys bilaterally, the largest of which measures the largest of which measures 2.0 x 1.6 cm in the upper pole of the left kidney.  The larger of these lesions are all compatible with simple cysts, while the smaller lesions are too small to definitively characterize.  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  Status post cholecystectomy.  Small calcification in the liver likely represents a calcified granuloma.  No other focal hepatic lesions are identified.  The appearance of the pancreas, spleen and bilateral adrenal glands is unremarkable.  There are a few scattered colonic diverticula, without surrounding inflammatory changes to suggest an acute diverticulitis at this time.  No ascites or pneumoperitoneum and no pathologic distension of bowel. No definite pathologic lymphadenopathy identified within the abdomen or pelvis.  Urinary bladder is unremarkable in appearance.  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  Additionally, at the L4-L5 level there is vacuum disc phenomenon, and there appears to be a  large right sided lateral disc extrusion (with some vacuum disc phenomenon) extending into the medial aspect of the right psoas muscle.  A well-defined sclerotic lesion in the left ilium is favored to represent a small bone island.  No other aggressive appearing lytic or blastic lesions are noted in the visualized portions of the skeleton.   Review of the MIP images confirms the above findings.  IMPRESSION: 1. Mild atherosclerosis, but no acute findings of the abdominal aorta to account for the patient's symptoms. 2.  There is multilevel degenerative disc disease and lumbar spondylosis, including vacuum disc phenomenon at the level of L4- L5, part of which extrudes into the medial aspect of the right psoas muscle.  This is new compared to prior examination from 11/05/2011, but the relationship of this finding to the patient's acute presentation is uncertain. 3.  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 4.  Status post cholecystectomy. 5.  Low attenuation renal lesions bilaterally, the largest of which are compatible with a simple  cyst, while the smaller lesions are too small to definitively characterize. 6.  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 7.  Status post cholecystectomy. 8.  Additional incidental findings, as above.   Original Report Authenticated By: Florencia Reasons, M.D.    Ct Angio Abdomen W/cm &/or Wo Contrast  05/01/2012  *RADIOLOGY REPORT*  Clinical Data:  Some onset of upper back pain.  Evaluate for potential aortic dissection.  CT ANGIOGRAPHY CHEST, ABDOMEN AND PELVIS  Technique:  Multidetector CT imaging through the chest, abdomen and pelvis was performed using the standard protocol during bolus administration of intravenous contrast.  Multiplanar reconstructed images including MIPs were obtained and reviewed to evaluate the vascular anatomy.  Contrast: OMNIPAQUE IOHEXOL 350 MG/ML SOLN,  Comparison:  CT of abdomen and pelvis 11/05/2011.  CTA CHEST  Findings:  Mediastinum: On precontrast images, there is no crescentic high attenuation associated with the wall of the thoracic aorta to suggest the presence of acute intramural hemorrhage.  Post contrast images demonstrate no aneurysm, dissection or penetrating aortic ulcer associated with the thoracic aorta.  Ascending thoracic aorta is mildly ectatic measuring 4.2 cm.  The arch measures 2.8 cm, and the descending thoracic aorta measures 2.8 cm in diameter as well. Heart size is  enlarged. There is no significant pericardial fluid, thickening or pericardial calcification. There is atherosclerosis of the thoracic aorta, the great vessels of the mediastinum and the coronary arteries, including calcified atherosclerotic plaque in the left anterior descending, left circumflex and right coronary arteries.  Mild thinning of the left ventricular myocardium in the region of the mid ventricular anteroseptal wall segment.  Calcified mediastinal and right hilar lymph nodes are incidentally noted. No pathologically enlarged  mediastinal or hilar lymph nodes. There is a small hiatal hernia.  The left lobe of the thyroid gland is slightly enlarged and heterogeneous in appearance.  Lungs/Pleura: Small area of subsegmental atelectasis or scarring in the lateral segment of the right middle lobe.  The no acute consolidative air space disease.  No pleural effusions.  The no definite suspicious appearing pulmonary nodules or masses are identified.  Small area of pleural-based scarring in the left apex.  Musculoskeletal: There are no aggressive appearing lytic or blastic lesions noted in the visualized portions of the skeleton.   Review of the MIP images confirms the above findings.  IMPRESSION: 1.  While there is very mild ectasia of the ascending thoracic aorta (4.2  cm in diameter), there are no findings to suggest acute aortic syndrome at this time.  2.  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  There is atherosclerosis, including at least three vessel coronary artery disease.  Assessment for potential risk factor modification, dietary therapy or pharmacologic therapy may be warranted, if clinically indicated. 3.  Small hiatal hernia. 4. The left lobe of the thyroid gland is slightly enlarged and heterogeneous in appearance.  This is nonspecific, and likely reflects a goiter.  However, if there is clinical concern for thyroid disease or neoplasm, further evaluation with non emergent thyroid ultrasound may be warranted. 5.  Additional incidental findings, as above.  CTA ABDOMEN AND PELVIS  Findings:  Abdomen/Pelvis:  Precontrast images demonstrate no crescentic areas of high attenuation associated with the wall of the abdominal aorta to suggest acute intramural hemorrhage.  There is mild atherosclerosis of the abdominal and pelvic vasculature, without definite aneurysm or dissection.  The  celiac axis, superior mesenteric artery and inferior mesenteric artery and their major branches are all widely patent.  There are several small nonobstructive calculi within the collecting systems of the kidneys bilaterally, largest of which measures up to 6 mm in the lower pole of the right kidney.  No ureteral calculi or findings to suggest urinary tract obstruction at this time.  Circumaortic left renal vein (a normal anatomical variant) is incidentally noted.  The left kidney has a altered axis (normal variant), which is unlikely to be related to any acute symptomatology.  Multiple low attenuation lesions in the kidneys bilaterally, the largest of which measures the largest of which measures 2.0 x 1.6 cm in the upper pole of the left kidney.  The larger of these lesions are all compatible with simple cysts, while the smaller lesions are too small to definitively characterize.  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  Status post cholecystectomy.  Small calcification in the liver likely represents a calcified granuloma.  No other focal hepatic lesions are identified.  The appearance of the pancreas, spleen and bilateral adrenal glands is unremarkable.  There are a few scattered colonic diverticula, without surrounding inflammatory changes to suggest an acute diverticulitis at this time.  No ascites or pneumoperitoneum and no pathologic distension of bowel. No definite pathologic lymphadenopathy identified within the abdomen or pelvis.  Urinary bladder is unremarkable in appearance.  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  Additionally, at the L4-L5 level there is vacuum disc phenomenon, and there appears to be a large right sided lateral disc extrusion (with some vacuum disc phenomenon) extending into the medial aspect of the right psoas muscle.  A well-defined sclerotic  lesion in the left ilium is favored to represent a small bone island.  No other aggressive appearing lytic or blastic lesions are noted in the visualized portions of the skeleton.   Review of the MIP images confirms the above findings.  IMPRESSION: 1. Mild atherosclerosis, but no acute findings of the abdominal aorta to account for the patient's symptoms. 2.  There is multilevel degenerative disc disease and lumbar spondylosis, including vacuum disc phenomenon at the level of L4- L5, part of which extrudes into the medial aspect of the right psoas muscle.  This is new compared to prior examination from 11/05/2011, but the relationship of  this finding to the patient's acute presentation is uncertain. 3.  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 4.  Status post cholecystectomy. 5.  Low attenuation renal lesions bilaterally, the largest of which are compatible with a simple cyst, while the smaller lesions are too small to definitively characterize. 6.  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 7.  Status post cholecystectomy. 8.  Additional incidental findings, as above.   Original Report Authenticated By: Florencia Reasons, M.D.    Dg Chest Port 1 View  05/01/2012  *RADIOLOGY REPORT*  Clinical Data: Pain.  PORTABLE CHEST - 1 VIEW  Comparison: No priors.  Findings: Study is limited by very lordotic positioning and rotation to the right.  As well, the left costophrenic sulcus is incompletely imaged.  Lung volumes are normal.  No definite consolidative airspace disease.  No obvious pleural effusion (a small left pleural effusion would be difficult to exclude).  Mild- moderate cardiomegaly. The patient is rotated to the right on today's exam, resulting in distortion of the mediastinal contours and reduced diagnostic sensitivity and specificity for mediastinal pathology.  The thoracic aorta appears mildly tortuous.  IMPRESSION: 1.   Given the limitations of this examination, accurate assessment for mediastinal widening suggestive of aortic dissection is not possible secondary to lordotic positioning and rotation.  If there is strong clinical concern for dissection, a repeat PA lateral chest radiograph, or CTA of the thorax could be obtained. 2.  Mild-moderate cardiomegaly.   Original Report Authenticated By: Florencia Reasons, M.D.    Ct Angio Chest Aorta W/cm &/or Wo/cm  05/01/2012  *RADIOLOGY REPORT*  Clinical Data:  Some onset of upper back pain.  Evaluate for potential aortic dissection.  CT ANGIOGRAPHY CHEST, ABDOMEN AND PELVIS  Technique:  Multidetector CT imaging through the chest, abdomen and pelvis was performed using the standard protocol during bolus administration of intravenous contrast.  Multiplanar reconstructed images including MIPs were obtained and reviewed to evaluate the vascular anatomy.  Contrast: OMNIPAQUE IOHEXOL 350 MG/ML SOLN,  Comparison:  CT of abdomen and pelvis 11/05/2011.  CTA CHEST  Findings:  Mediastinum: On precontrast images, there is no crescentic high attenuation associated with the wall of the thoracic aorta to suggest the presence of acute intramural hemorrhage.  Post contrast images demonstrate no aneurysm, dissection or penetrating aortic ulcer associated with the thoracic aorta.  Ascending thoracic aorta is mildly ectatic measuring 4.2 cm.  The arch measures 2.8 cm, and the descending thoracic aorta measures 2.8 cm in diameter as well. Heart size is  enlarged. There is no significant pericardial fluid, thickening or pericardial calcification. There is atherosclerosis of the thoracic aorta, the great vessels of the mediastinum and the coronary arteries, including calcified atherosclerotic plaque in the left anterior descending, left circumflex and right coronary arteries.  Mild thinning of the left ventricular myocardium in the region of the mid ventricular anteroseptal wall segment.  Calcified  mediastinal and right hilar lymph nodes are incidentally noted. No pathologically enlarged mediastinal or hilar lymph nodes. There is a small hiatal hernia.  The left lobe of the thyroid gland is slightly enlarged and heterogeneous in appearance.  Lungs/Pleura: Small area of subsegmental atelectasis or scarring in the lateral segment of the right middle lobe.  The no acute consolidative air space disease.  No pleural effusions.  The no definite suspicious appearing pulmonary nodules or masses are identified.  Small area of pleural-based scarring in the left apex.  Musculoskeletal: There are no aggressive appearing lytic or blastic lesions noted in the visualized portions of the skeleton.   Review of the MIP images confirms the above findings.  IMPRESSION: 1.  While there is very mild ectasia of the ascending thoracic aorta (4.2 cm in diameter), there are no findings to suggest acute aortic syndrome at this time.  2.  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  There is atherosclerosis, including at least three vessel coronary artery disease.  Assessment for potential risk factor modification, dietary therapy or pharmacologic therapy may be warranted, if clinically indicated. 3.  Small hiatal hernia. 4. The left lobe of the thyroid gland is slightly enlarged and heterogeneous in appearance.  This is nonspecific, and likely reflects a goiter.  However, if there is clinical concern for thyroid disease or neoplasm, further evaluation with non emergent thyroid ultrasound may be warranted. 5.  Additional incidental findings, as above.  CTA ABDOMEN AND PELVIS  Findings:  Abdomen/Pelvis:  Precontrast images demonstrate no crescentic areas of high attenuation associated with the wall of the abdominal aorta to suggest acute intramural hemorrhage.  There is mild atherosclerosis  of the abdominal and pelvic vasculature, without definite aneurysm or dissection.  The celiac axis, superior mesenteric artery and inferior mesenteric artery and their major branches are all widely patent.  There are several small nonobstructive calculi within the collecting systems of the kidneys bilaterally, largest of which measures up to 6 mm in the lower pole of the right kidney.  No ureteral calculi or findings to suggest urinary tract obstruction at this time.  Circumaortic left renal vein (a normal anatomical variant) is incidentally noted.  The left kidney has a altered axis (normal variant), which is unlikely to be related to any acute symptomatology.  Multiple low attenuation lesions in the kidneys bilaterally, the largest of which measures the largest of which measures 2.0 x 1.6 cm in the upper pole of the left kidney.  The larger of these lesions are all compatible with simple cysts, while the smaller lesions are too small to definitively characterize.  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  Status post cholecystectomy.  Small calcification in the liver likely represents a calcified granuloma.  No other focal hepatic lesions are identified.  The appearance of the pancreas, spleen and bilateral adrenal glands is unremarkable.  There are a few scattered colonic diverticula, without surrounding inflammatory changes to suggest an acute diverticulitis at this time.  No ascites or pneumoperitoneum and no pathologic distension of bowel. No definite pathologic lymphadenopathy identified within the abdomen or pelvis.  Urinary bladder is unremarkable in appearance.  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  Additionally, at the L4-L5 level there is vacuum disc phenomenon, and there appears to be a large right sided lateral disc extrusion (with some vacuum disc  phenomenon) extending into the medial aspect of the right psoas muscle.  A well-defined sclerotic lesion in the left ilium is favored to represent a small bone island.  No other aggressive appearing lytic or blastic lesions are noted in the visualized portions of the skeleton.   Review of the MIP images confirms the above findings.  IMPRESSION: 1. Mild atherosclerosis, but no acute findings of the abdominal aorta to account for the patient's symptoms. 2.  There  is multilevel degenerative disc disease and lumbar spondylosis, including vacuum disc phenomenon at the level of L4- L5, part of which extrudes into the medial aspect of the right psoas muscle.  This is new compared to prior examination from 11/05/2011, but the relationship of this finding to the patient's acute presentation is uncertain. 3.  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 4.  Status post cholecystectomy. 5.  Low attenuation renal lesions bilaterally, the largest of which are compatible with a simple cyst, while the smaller lesions are too small to definitively characterize. 6.  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 7.  Status post cholecystectomy. 8.  Additional incidental findings, as above.   Original Report Authenticated By: Florencia Reasons, M.D.      1. Atrial flutter   2. Bradycardia   3. Atypical chest pain   4. Back pain    CRITICAL CARE Performed by: Cyndra Numbers   Total critical care time: 45 minutes  Critical care time was exclusive of separately billable procedures and treating other patients.  Critical care was necessary to treat or prevent imminent or life-threatening deterioration.  Critical care was time spent personally by me on the following activities: development of treatment plan with patient and/or surrogate as well as nursing, discussions with consultants, evaluation of patient's response to treatment,  examination of patient, obtaining history from patient or surrogate, ordering and performing treatments and interventions, ordering and review of laboratory studies, ordering and review of radiographic studies, pulse oximetry and re-evaluation of patient's condition.    MDM  Patient was evaluated by myself.  He complained of back pain "between his shoulders" that was severe and noted to have atrial flutter with bradycardia in the 40s (without hypotension)  and weakness.  CXR was performed at bedside and raised concern for widened mediastinum though this was a portable film.  Given location of symptoms and constellation of these I was concerned for possible aortic dissection.  This was evaluated with CT angio and ruled out.  Incidental findings on abdominal portion of study relating to back did not correlate with patient history as an explanation of symptoms.  Patient improved to 6/10 pain with morphine.  CBC, CMP, TNI, CXR, were all unremarkable.  INR was slightly subtherapeutic at 1.99.  There was no leukocytosis, anemia, or elevation of TNI.  Pain improved to 6/10 with morphine and following CT ASA was given as patient continued to have pain, bradycardia, and has significant RFs for CAD with 3 vessel disease noted on scan and RF for atypical presentation present as patient is elderly and diabetic.  Patient was given NTG which also improved his pain.  Based on this I discussed the patient with Dr. Johney Frame as patient had unknown cardiologist.  Prior to discussion I had discussed hospital choice with patient as he has no definite cardiologist but PCP is normally at [facility].  Patient reported that he wanted to go to [facility] rather than [facility].  Dr. Johney Frame agreed with plan for nitro gtt given presentation and patient will be transferred to stepdown.  Dr. Johney Frame aware that patient is without CP.  Patient transferred.         Cyndra Numbers, MD 05/02/12 [id number]  Cyndra Numbers, MD 05/02/12 [id number]  Cyndra Numbers, MD 05/02/12  [id number]  Cyndra Numbers, MD 05/02/12 [phone]

## 2012-05-02 NOTE — Care Management Note (Signed)
    Page 1 of 1   05/02/2012     9:02:58 AM   CARE MANAGEMENT NOTE 05/02/2012  Patient:  Alan Carey, Alan Carey   Account Number:  [id number]  Date Initiated:  05/02/2012  Documentation initiated by:  Junius Creamer  Subjective/Objective Assessment:   adm w back pain, at flutter     Action/Plan:   lives w wife   Anticipated DC Date:     Anticipated DC Plan:  HOME/SELF CARE      DC Planning Services  CM consult      Choice offered to / List presented to:             Status of service:   Medicare Important Message given?   (If response is "NO", the following Medicare IM given date fields will be blank) Date Medicare IM given:   Date Additional Medicare IM given:    Discharge Disposition:  HOME/SELF CARE  Per UR Regulation:  Reviewed for med. necessity/level of care/duration of stay  If discussed at Long Length of Stay Meetings, dates discussed:    Comments:  9/9 9a debbie Chanteria Haggard rn,bsn [phone]

## 2012-05-02 NOTE — Progress Notes (Addendum)
ANTICOAGULATION CONSULT NOTE - Follow Up Consult  Pharmacy Consult for Coumadin Indication: atrial fibrillation  Allergies  Allergen Reactions  . Penicillins Itching and Rash    Patient Measurements: Height: 6' (182.9 cm) Weight: 246 lb 14.6 oz (112 kg) IBW/kg (Calculated) : 77.6    Vital Signs: Temp: 97.4 F (36.3 C) (09/09 0826) Temp src: Oral (09/09 0826) BP: 157/78 mmHg (09/09 0826) Pulse Rate: 40  (09/09 0826)  Labs:  Basename 05/02/12 0515 05/01/12 1115  HGB -- 14.6  HCT -- 45.1  PLT -- 146*  APTT -- --  LABPROT 35.7* 22.9*  INR 3.51* 1.99*  HEPARINUNFRC -- --  CREATININE -- 0.90  CKTOTAL -- --  CKMB -- --  TROPONINI -- <0.30    Estimated Creatinine Clearance: 98.7 ml/min (by C-G formula based on Cr of 0.9).   Medications:  Coumadin 10 mg PO daily, except 7.5 mg on Sundays  Assessment: 71 yo male with history of afib here for back pain. INR=1.99 at baseline, today 3.5; home dose 10 mg Mon & Wed, 7.5 mg all other days. Elevation possibly due to stress. CBC stable, no bleeding complications.  Goal of Therapy:  INR 2-3 Monitor platelets by anticoagulation protocol: Yes   Plan:  Hold Coumadin tonight.  Resume home dose tomorrow (9/10) and follow-up with INR check later this week if possible.  Get INR in AM  Juanita Craver 05/02/2012,8:47 AM  Patient seen and discussed on unit rounds with pharmacy student. I agree with the above assessment and plan. INR in am will likely direct further warfarin therapy.  Hold warfarin today INR in am  Sheppard Coil PharmD [phone]

## 2012-05-03 ENCOUNTER — Encounter (HOSPITAL_COMMUNITY): Payer: Self-pay | Admitting: Cardiology

## 2012-05-03 LAB — PROTIME-INR
INR: 2.37 — ABNORMAL HIGH (ref 0.00–1.49)
Prothrombin Time: 26.3 seconds — ABNORMAL HIGH (ref 11.6–15.2)

## 2012-05-03 NOTE — Discharge Summary (Signed)
Discharge Summary   Patient ID: Alan Carey MRN: [id number], DOB/AGE: [birth year] 70 y.o.  Primary MD: Dr Synetta Fail [facility] Primary Cardiologist: Dr Rinaldo Ratel Cardiology Admit date: 05/01/2012 D/C date:     05/03/2012      Primary Discharge Diagnoses:  1. Back Pain  - CT revealed no aneurysm, dissection, or other acute findings  - No objective evidence of ischemia  - Likely musculoskeletal or passage of kidney stone  - F/u with PCP  2. Bradycardia  - Asymptomatic   - BB dc'd  - F/u w/ primary cardiologist to discuss possible need for pacer in the future  3. Atrial Flutter  - On chronic coumadin  - BB stopped due to #2  Secondary Discharge Diagnoses:  1. Hyperlipidemia 2. Hypertension 3. Diabetes mellitus 4. Arthritis 5. Gout 6. Prostate Cancer 7. Cholecystectomy 8. Kidney stone surgery   9. Thyroid surgery   10. Kidney surgery   11. Shoulder debridement L and R   12. Prostate surgery had prostate cancer approx. 3 yrs ago    Allergies Allergies  Allergen Reactions  . Penicillins Itching and Rash    Diagnostic Studies/Procedures:   05/01/2012 CTA CHEST   Findings:  Mediastinum: On precontrast images, there is no crescentic high attenuation associated with the wall of the thoracic aorta to suggest the presence of acute intramural hemorrhage.  Post contrast images demonstrate no aneurysm, dissection or penetrating aortic ulcer associated with the thoracic aorta.  Ascending thoracic aorta is mildly ectatic measuring 4.2 cm.  The arch measures 2.8 cm, and the descending thoracic aorta measures 2.8 cm in diameter as well. Heart size is  enlarged. There is no significant pericardial fluid, thickening or pericardial calcification. There is atherosclerosis of the thoracic aorta, the great vessels of the mediastinum and the coronary arteries, including calcified atherosclerotic plaque in the left anterior descending, left circumflex and  right coronary arteries.  Mild thinning of the left ventricular myocardium in the region of the mid ventricular anteroseptal wall segment.  Calcified mediastinal and right hilar lymph nodes are incidentally noted. No pathologically enlarged mediastinal or hilar lymph nodes. There is a small hiatal hernia.  The left lobe of the thyroid gland is slightly enlarged and heterogeneous in appearance.  Lungs/Pleura: Small area of subsegmental atelectasis or scarring in the lateral segment of the right middle lobe.  The no acute consolidative air space disease.  No pleural effusions.  The no definite suspicious appearing pulmonary nodules or masses are identified.  Small area of pleural-based scarring in the left apex.  Musculoskeletal: There are no aggressive appearing lytic or blastic lesions noted in the visualized portions of the skeleton.   Review of the MIP images confirms the above findings.  IMPRESSION: 1.  While there is very mild ectasia of the ascending thoracic aorta (4.2 cm in diameter), there are no findings to suggest acute aortic syndrome at this time.  2.  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  There is atherosclerosis, including at least three vessel coronary artery disease.  Assessment for potential risk factor modification, dietary therapy or pharmacologic therapy may be warranted, if clinically indicated. 3.  Small hiatal hernia. 4. The left lobe of the thyroid gland is slightly enlarged and heterogeneous in appearance.  This is nonspecific, and likely reflects a goiter.  However, if there is clinical concern for thyroid disease or neoplasm,  further evaluation with non emergent thyroid ultrasound may be warranted. 5.  Additional incidental findings, as above.    CTA ABDOMEN AND PELVIS   Findings:  Abdomen/Pelvis:  Precontrast images demonstrate no  crescentic areas of high attenuation associated with the wall of the abdominal aorta to suggest acute intramural hemorrhage.  There is mild atherosclerosis of the abdominal and pelvic vasculature, without definite aneurysm or dissection.  The celiac axis, superior mesenteric artery and inferior mesenteric artery and their major branches are all widely patent.  There are several small nonobstructive calculi within the collecting systems of the kidneys bilaterally, largest of which measures up to 6 mm in the lower pole of the right kidney.  No ureteral calculi or findings to suggest urinary tract obstruction at this time.  Circumaortic left renal vein (a normal anatomical variant) is incidentally noted.  The left kidney has a altered axis (normal variant), which is unlikely to be related to any acute symptomatology.  Multiple low attenuation lesions in the kidneys bilaterally, the largest of which measures the largest of which measures 2.0 x 1.6 cm in the upper pole of the left kidney.  The larger of these lesions are all compatible with simple cysts, while the smaller lesions are too small to definitively characterize.  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  Status post cholecystectomy.  Small calcification in the liver likely represents a calcified granuloma.  No other focal hepatic lesions are identified.  The appearance of the pancreas, spleen and bilateral adrenal glands is unremarkable.  There are a few scattered colonic diverticula, without surrounding inflammatory changes to suggest an acute diverticulitis at this time.  No ascites or pneumoperitoneum and no pathologic distension of bowel. No definite pathologic lymphadenopathy identified within the abdomen or pelvis.  Urinary bladder is unremarkable in appearance.  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   Additionally, at the L4-L5 level there is vacuum disc phenomenon, and there appears to be a large right sided lateral disc extrusion (with some vacuum disc phenomenon) extending into the medial aspect of the right psoas muscle.  A well-defined sclerotic lesion in the left ilium is favored to represent a small bone island.  No other aggressive appearing lytic or blastic lesions are noted in the visualized portions of the skeleton.   Review of the MIP images confirms the above findings.  IMPRESSION: 1. Mild atherosclerosis, but no acute findings of the abdominal aorta to account for the patient's symptoms. 2.  There is multilevel degenerative disc disease and lumbar spondylosis, including vacuum disc phenomenon at the level of L4- L5, part of which extrudes into the medial aspect of the right psoas muscle.  This is new compared to prior examination from 11/05/2011, but the relationship of this finding to the patient's acute presentation is uncertain. 3.  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 4.  Status post cholecystectomy. 5.  Low attenuation renal lesions bilaterally, the largest of which are compatible with a simple cyst, while the smaller lesions are too small to definitively characterize. 6.  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 7.  Status post cholecystectomy. 8.  Additional incidental findings, as above.    History of Present Illness: 71 y.o. male w/ the above medical problems who presented to [facility] on 05/01/12 with complaints of back pain. He reported insidious,  progressive mid back pain unresolved with pain meds that intensified on day of presentation prompting him to present to the [facility] where he was given morphine and NTG and transferred to [facility] for further evaluation and treatment.  Hospital Course: In the ED, EKG revealed Atrial flutter w/ variable AVB, 40bpm,  nonspecific ST/T abnormality. Labs were significant for normal troponin, INR 1.99, BNP 524, unremarkable CBC/CMET. CTA abdomen/pelvis/chest revealed 3v atherosclerotic disease and multiple degenerative disc disease with previous lumbar surgery, but no aneurysm, dissection, or other acute findings. He was admitted for further evaluation and treatment.   Given his bradycardia Lopressor was discontinued and he was monitored on telemetry. He had no significant pauses, lightheadedness, or episodes of syncope. He reported improvement in back pain. It was felt his back pain was likely musculoskeletal or possibly r/t passage of kidney stone. No further ischemic evaluation was warranted. He was able to ambulate if appropriate heart rate response and without chest pain or sob. He was seen and evaluated by Dr. Eden Emms who felt he was stable for discharge home with plans for follow up as scheduled below.  Discharge Vitals: Blood pressure 154/80, pulse 42, temperature 97 F (36.1 C), temperature source Oral, resp. rate 18, height 6' (1.829 m), weight 246 lb 14 oz (111.982 kg), SpO2 97.00%.  Labs: Component Value Date   WBC 7.8 05/01/2012   HGB 14.6 05/01/2012   HCT 45.1 05/01/2012   MCV 82.6 05/01/2012   PLT 146* 05/01/2012     05/03/2012 04:33  Prothrombin Time 26.3 (H)  INR 2.37 (H)   Lab 05/01/12 1115  NA 142  K 3.6  CL 101  CO2 29  BUN 22  CREATININE 0.90  CALCIUM 9.3  PROT 7.0  BILITOT 0.5  ALKPHOS 62  ALT 16  AST 18  GLUCOSE 178*   Basename 05/01/12 1115  TROPONINI <0.30     05/01/2012 11:15  Pro B Natriuretic peptide (BNP) 524.1 (H)     05/01/2012 23:59  TSH 0.868    Discharge Medications   Medication List  As of 05/03/2012 10:03 AM   STOP taking these medications         metoprolol 100 MG tablet         TAKE these medications         allopurinol 300 MG tablet   Commonly known as: ZYLOPRIM   Take 300 mg by mouth daily.      chlorthalidone 25 MG tablet   Commonly known as:  HYGROTON   Take 25 mg by mouth daily.      fenofibrate 160 MG tablet   Take 160 mg by mouth daily.      insulin glargine 100 UNIT/ML injection   Commonly known as: LANTUS   Inject 70 Units into the skin at bedtime.      insulin lispro 100 UNIT/ML injection   Commonly known as: HUMALOG   Inject 20 Units into the skin 3 (three) times daily before meals.      losartan-hydrochlorothiazide 100-12.5 MG per tablet   Commonly known as: HYZAAR   Take 1 tablet by mouth daily.      metFORMIN 500 MG tablet   Commonly known as: GLUCOPHAGE   Take 1,000 mg by mouth 2 (two) times daily with a meal.      omeprazole 20 MG capsule   Commonly known as: PRILOSEC   Take 40 mg by mouth daily.      pregabalin 100 MG capsule   Commonly  known as: LYRICA   Take 200 mg by mouth 3 (three) times daily.      simvastatin 40 MG tablet   Commonly known as: ZOCOR   Take 40 mg by mouth every evening.      traMADol 50 MG tablet   Commonly known as: ULTRAM   Take 100 mg by mouth 4 (four) times daily.      warfarin 5 MG tablet   Commonly known as: COUMADIN   Take 7.5-10 mg by mouth daily. Take 10 MG on Mondays and Tuesdays; on Wednesday, Thursday, Friday, Saturday, and Sunday, take 7.5 MG            Disposition   Discharge Orders    Future Orders Please Complete By Expires   Diet - low sodium heart healthy      Increase activity slowly      Discharge instructions      Comments:   * There were no findings to suggest your back pain was coming from your heart or major arteries. Please follow up with your primary care provider.  * Your Lopressor was stopped due to low heart rates. Please follow up with your primary cardiologist regarding your slow heart rate.     Follow-up Information    Follow up with JOBE,DANIEL B., MD. (As needed)    Contact information:   [address] [place] [address] [phone]       Follow up with Dr. Deno Etienne [facility]. (As needed)            Outstanding Labs/Studies:  None  Duration of Discharge Encounter: Greater than 30 minutes including physician and PA time.  Signed, Lamel Mccarley PA-C 05/03/2012, 10:03 AM

## 2012-05-03 NOTE — Progress Notes (Signed)
PT Note  Order received.  Pt standing at sink and shaving.  Reports no difficulty with mobility which nurse tech confirms.  No PT eval needed.  [employer] PT [phone]

## 2012-05-03 NOTE — Progress Notes (Signed)
Pt discharged per Md order and protocol. All discharged instructions reviewed with patient and all questions answered. Pt aware of follow up appointments.

## 2012-05-03 NOTE — Progress Notes (Signed)
Patient ID: Alan Carey, male   DOB: [birth year], 70 y.o.   MRN: [id number]    Subjective:  Denies SSCP, palpitations or Dyspnea Back pain is better Sees [facility] No light headedness with ambulation  Objective:  Filed Vitals:   05/02/12 1127 05/02/12 1400 05/02/12 2021 05/03/12 0428  BP: 148/62 152/73 160/73 154/80  Pulse: 42 50 49 42  Temp: 97.5 F (36.4 C) 97.7 F (36.5 C) 98.5 F (36.9 C) 97 F (36.1 C)  TempSrc: Oral Oral Oral Oral  Resp: 16 18 18 18   Height:      Weight:    246 lb 14 oz (111.982 kg)  SpO2: 97% 99% 95% 97%    Intake/Output from previous day:  Intake/Output Summary (Last 24 hours) at 05/03/12 3086 Last data filed at 05/02/12 1800  Gross per 24 hour  Intake    846 ml  Output   1000 ml  Net   -154 ml    Physical Exam: Affect appropriate Healthy:  appears stated age HEENT: normal Neck supple with no adenopathy JVP normal no bruits no thyromegaly Lungs clear with no wheezing and good diaphragmatic motion Heart:  S1/S2 no murmur, no rub, gallop or click PMI normal Abdomen: benighn, BS positve, no tenderness, no AAA no bruit.  No HSM or HJR Distal pulses intact with no bruits No edema Neuro non-focal Skin warm and dry No muscular weakness   Lab Results: Basic Metabolic Panel:  Basename 05/01/12 1115  NA 142  K 3.6  CL 101  CO2 29  GLUCOSE 178*  BUN 22  CREATININE 0.90  CALCIUM 9.3  MG --  PHOS --   Liver Function Tests:  Basename 05/01/12 1115  AST 18  ALT 16  ALKPHOS 62  BILITOT 0.5  PROT 7.0  ALBUMIN 3.9   No results found for this basename: LIPASE:2,AMYLASE:2 in the last 72 hours CBC:  Basename 05/01/12 1115  WBC 7.8  NEUTROABS 4.1  HGB 14.6  HCT 45.1  MCV 82.6  PLT 146*   Cardiac Enzymes:  Basename 05/01/12 1115  CKTOTAL --  CKMB --  CKMBINDEX --  TROPONINI <0.30    Imaging: Ct Angio Pelvis W/cm &/or Wo/cm  05/01/2012  *RADIOLOGY REPORT*  Clinical Data:  Some onset of upper back  pain.  Evaluate for potential aortic dissection.  CT ANGIOGRAPHY CHEST, ABDOMEN AND PELVIS  Technique:  Multidetector CT imaging through the chest, abdomen and pelvis was performed using the standard protocol during bolus administration of intravenous contrast.  Multiplanar reconstructed images including MIPs were obtained and reviewed to evaluate the vascular anatomy.  Contrast: OMNIPAQUE IOHEXOL 350 MG/ML SOLN,  Comparison:  CT of abdomen and pelvis 11/05/2011.  CTA CHEST  Findings:  Mediastinum: On precontrast images, there is no crescentic high attenuation associated with the wall of the thoracic aorta to suggest the presence of acute intramural hemorrhage.  Post contrast images demonstrate no aneurysm, dissection or penetrating aortic ulcer associated with the thoracic aorta.  Ascending thoracic aorta is mildly ectatic measuring 4.2 cm.  The arch measures 2.8 cm, and the descending thoracic aorta measures 2.8 cm in diameter as well. Heart size is  enlarged. There is no significant pericardial fluid, thickening or pericardial calcification. There is atherosclerosis of the thoracic aorta, the great vessels of the mediastinum and the coronary arteries, including calcified atherosclerotic plaque in the left anterior descending, left circumflex and right coronary arteries.  Mild thinning of the left ventricular myocardium in the region of the mid ventricular  anteroseptal wall segment.  Calcified mediastinal and right hilar lymph nodes are incidentally noted. No pathologically enlarged mediastinal or hilar lymph nodes. There is a small hiatal hernia.  The left lobe of the thyroid gland is slightly enlarged and heterogeneous in appearance.  Lungs/Pleura: Small area of subsegmental atelectasis or scarring in the lateral segment of the right middle lobe.  The no acute consolidative air space disease.  No pleural effusions.  The no definite suspicious appearing pulmonary nodules or masses are identified.  Small  area of pleural-based scarring in the left apex.  Musculoskeletal: There are no aggressive appearing lytic or blastic lesions noted in the visualized portions of the skeleton.   Review of the MIP images confirms the above findings.  IMPRESSION: 1.  While there is very mild ectasia of the ascending thoracic aorta (4.2 cm in diameter), there are no findings to suggest acute aortic syndrome at this time.  2.  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  There is atherosclerosis, including at least three vessel coronary artery disease.  Assessment for potential risk factor modification, dietary therapy or pharmacologic therapy may be warranted, if clinically indicated. 3.  Small hiatal hernia. 4. The left lobe of the thyroid gland is slightly enlarged and heterogeneous in appearance.  This is nonspecific, and likely reflects a goiter.  However, if there is clinical concern for thyroid disease or neoplasm, further evaluation with non emergent thyroid ultrasound may be warranted. 5.  Additional incidental findings, as above.  CTA ABDOMEN AND PELVIS  Findings:  Abdomen/Pelvis:  Precontrast images demonstrate no crescentic areas of high attenuation associated with the wall of the abdominal aorta to suggest acute intramural hemorrhage.  There is mild atherosclerosis of the abdominal and pelvic vasculature, without definite aneurysm or dissection.  The celiac axis, superior mesenteric artery and inferior mesenteric artery and their major branches are all widely patent.  There are several small nonobstructive calculi within the collecting systems of the kidneys bilaterally, largest of which measures up to 6 mm in the lower pole of the right kidney.  No ureteral calculi or findings to suggest urinary tract obstruction at this time.  Circumaortic left renal vein (a normal  anatomical variant) is incidentally noted.  The left kidney has a altered axis (normal variant), which is unlikely to be related to any acute symptomatology.  Multiple low attenuation lesions in the kidneys bilaterally, the largest of which measures the largest of which measures 2.0 x 1.6 cm in the upper pole of the left kidney.  The larger of these lesions are all compatible with simple cysts, while the smaller lesions are too small to definitively characterize.  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  Status post cholecystectomy.  Small calcification in the liver likely represents a calcified granuloma.  No other focal hepatic lesions are identified.  The appearance of the pancreas, spleen and bilateral adrenal glands is unremarkable.  There are a few scattered colonic diverticula, without surrounding inflammatory changes to suggest an acute diverticulitis at this time.  No ascites or pneumoperitoneum and no pathologic distension of bowel. No definite pathologic lymphadenopathy identified within the abdomen or pelvis.  Urinary bladder is unremarkable in appearance.  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  Additionally, at the L4-L5 level there is vacuum disc  phenomenon, and there appears to be a large right sided lateral disc extrusion (with some vacuum disc phenomenon) extending into the medial aspect of the right psoas muscle.  A well-defined sclerotic lesion in the left ilium is favored to represent a small bone island.  No other aggressive appearing lytic or blastic lesions are noted in the visualized portions of the skeleton.   Review of the MIP images confirms the above findings.  IMPRESSION: 1. Mild atherosclerosis, but no acute findings of the abdominal aorta to account for the patient's symptoms. 2.  There is multilevel degenerative disc disease and lumbar  spondylosis, including vacuum disc phenomenon at the level of L4- L5, part of which extrudes into the medial aspect of the right psoas muscle.  This is new compared to prior examination from 11/05/2011, but the relationship of this finding to the patient's acute presentation is uncertain. 3.  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 4.  Status post cholecystectomy. 5.  Low attenuation renal lesions bilaterally, the largest of which are compatible with a simple cyst, while the smaller lesions are too small to definitively characterize. 6.  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 7.  Status post cholecystectomy. 8.  Additional incidental findings, as above.   Original Report Authenticated By: Florencia Reasons, M.D.    Ct Angio Abdomen W/cm &/or Wo Contrast  05/01/2012  *RADIOLOGY REPORT*  Clinical Data:  Some onset of upper back pain.  Evaluate for potential aortic dissection.  CT ANGIOGRAPHY CHEST, ABDOMEN AND PELVIS  Technique:  Multidetector CT imaging through the chest, abdomen and pelvis was performed using the standard protocol during bolus administration of intravenous contrast.  Multiplanar reconstructed images including MIPs were obtained and reviewed to evaluate the vascular anatomy.  Contrast: OMNIPAQUE IOHEXOL 350 MG/ML SOLN,  Comparison:  CT of abdomen and pelvis 11/05/2011.  CTA CHEST  Findings:  Mediastinum: On precontrast images, there is no crescentic high attenuation associated with the wall of the thoracic aorta to suggest the presence of acute intramural hemorrhage.  Post contrast images demonstrate no aneurysm, dissection or penetrating aortic ulcer associated with the thoracic aorta.  Ascending thoracic aorta is mildly ectatic measuring 4.2 cm.  The arch measures 2.8 cm, and the descending thoracic aorta measures 2.8 cm in diameter as well. Heart size is  enlarged. There is no significant  pericardial fluid, thickening or pericardial calcification. There is atherosclerosis of the thoracic aorta, the great vessels of the mediastinum and the coronary arteries, including calcified atherosclerotic plaque in the left anterior descending, left circumflex and right coronary arteries.  Mild thinning of the left ventricular myocardium in the region of the mid ventricular anteroseptal wall segment.  Calcified mediastinal and right hilar lymph nodes are incidentally noted. No pathologically enlarged mediastinal or hilar lymph nodes. There is a small hiatal hernia.  The left lobe of the thyroid gland is slightly enlarged and heterogeneous in appearance.  Lungs/Pleura: Small area of subsegmental atelectasis or scarring in the lateral segment of the right middle lobe.  The no acute consolidative air space disease.  No pleural effusions.  The no definite suspicious appearing pulmonary nodules or masses are identified.  Small area of pleural-based scarring in the left apex.  Musculoskeletal: There are no aggressive appearing lytic or blastic lesions noted in the visualized portions of the skeleton.   Review of the MIP images confirms the above findings.  IMPRESSION: 1.  While there is very mild  ectasia of the ascending thoracic aorta (4.2 cm in diameter), there are no findings to suggest acute aortic syndrome at this time.  2.  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  There is atherosclerosis, including at least three vessel coronary artery disease.  Assessment for potential risk factor modification, dietary therapy or pharmacologic therapy may be warranted, if clinically indicated. 3.  Small hiatal hernia. 4. The left lobe of the thyroid gland is slightly enlarged and heterogeneous in appearance.  This is nonspecific, and likely reflects a goiter.  However, if  there is clinical concern for thyroid disease or neoplasm, further evaluation with non emergent thyroid ultrasound may be warranted. 5.  Additional incidental findings, as above.  CTA ABDOMEN AND PELVIS  Findings:  Abdomen/Pelvis:  Precontrast images demonstrate no crescentic areas of high attenuation associated with the wall of the abdominal aorta to suggest acute intramural hemorrhage.  There is mild atherosclerosis of the abdominal and pelvic vasculature, without definite aneurysm or dissection.  The celiac axis, superior mesenteric artery and inferior mesenteric artery and their major branches are all widely patent.  There are several small nonobstructive calculi within the collecting systems of the kidneys bilaterally, largest of which measures up to 6 mm in the lower pole of the right kidney.  No ureteral calculi or findings to suggest urinary tract obstruction at this time.  Circumaortic left renal vein (a normal anatomical variant) is incidentally noted.  The left kidney has a altered axis (normal variant), which is unlikely to be related to any acute symptomatology.  Multiple low attenuation lesions in the kidneys bilaterally, the largest of which measures the largest of which measures 2.0 x 1.6 cm in the upper pole of the left kidney.  The larger of these lesions are all compatible with simple cysts, while the smaller lesions are too small to definitively characterize.  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  Status post cholecystectomy.  Small calcification in the liver likely represents a calcified granuloma.  No other focal hepatic lesions are identified.  The appearance of the pancreas, spleen and bilateral adrenal glands is unremarkable.  There are a few scattered colonic diverticula, without surrounding inflammatory changes to suggest an acute diverticulitis at this time.  No ascites or pneumoperitoneum and no pathologic distension of bowel. No  definite pathologic lymphadenopathy identified within the abdomen or pelvis.  Urinary bladder is unremarkable in appearance.  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  Additionally, at the L4-L5 level there is vacuum disc phenomenon, and there appears to be a large right sided lateral disc extrusion (with some vacuum disc phenomenon) extending into the medial aspect of the right psoas muscle.  A well-defined sclerotic lesion in the left ilium is favored to represent a small bone island.  No other aggressive appearing lytic or blastic lesions are noted in the visualized portions of the skeleton.   Review of the MIP images confirms the above findings.  IMPRESSION: 1. Mild atherosclerosis, but no acute findings of the abdominal aorta to account for the patient's symptoms. 2.  There is multilevel degenerative disc disease and lumbar spondylosis, including vacuum disc phenomenon at the level of L4- L5, part of which extrudes into the medial aspect of the right psoas muscle.  This is new compared to prior  examination from 11/05/2011, but the relationship of this finding to the patient's acute presentation is uncertain. 3.  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 4.  Status post cholecystectomy. 5.  Low attenuation renal lesions bilaterally, the largest of which are compatible with a simple cyst, while the smaller lesions are too small to definitively characterize. 6.  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 7.  Status post cholecystectomy. 8.  Additional incidental findings, as above.   Original Report Authenticated By: Florencia Reasons, M.D.    Dg Chest Port 1 View  05/01/2012  *RADIOLOGY REPORT*  Clinical Data: Pain.  PORTABLE CHEST - 1 VIEW  Comparison: No priors.  Findings: Study is limited by very lordotic positioning and  rotation to the right.  As well, the left costophrenic sulcus is incompletely imaged.  Lung volumes are normal.  No definite consolidative airspace disease.  No obvious pleural effusion (a small left pleural effusion would be difficult to exclude).  Mild- moderate cardiomegaly. The patient is rotated to the right on today's exam, resulting in distortion of the mediastinal contours and reduced diagnostic sensitivity and specificity for mediastinal pathology.  The thoracic aorta appears mildly tortuous.  IMPRESSION: 1.  Given the limitations of this examination, accurate assessment for mediastinal widening suggestive of aortic dissection is not possible secondary to lordotic positioning and rotation.  If there is strong clinical concern for dissection, a repeat PA lateral chest radiograph, or CTA of the thorax could be obtained. 2.  Mild-moderate cardiomegaly.   Original Report Authenticated By: Florencia Reasons, M.D.    Ct Angio Chest Aorta W/cm &/or Wo/cm  05/01/2012  *RADIOLOGY REPORT*  Clinical Data:  Some onset of upper back pain.  Evaluate for potential aortic dissection.  CT ANGIOGRAPHY CHEST, ABDOMEN AND PELVIS  Technique:  Multidetector CT imaging through the chest, abdomen and pelvis was performed using the standard protocol during bolus administration of intravenous contrast.  Multiplanar reconstructed images including MIPs were obtained and reviewed to evaluate the vascular anatomy.  Contrast: OMNIPAQUE IOHEXOL 350 MG/ML SOLN,  Comparison:  CT of abdomen and pelvis 11/05/2011.  CTA CHEST  Findings:  Mediastinum: On precontrast images, there is no crescentic high attenuation associated with the wall of the thoracic aorta to suggest the presence of acute intramural hemorrhage.  Post contrast images demonstrate no aneurysm, dissection or penetrating aortic ulcer associated with the thoracic aorta.  Ascending thoracic aorta is mildly ectatic measuring 4.2 cm.  The arch measures 2.8 cm, and the  descending thoracic aorta measures 2.8 cm in diameter as well. Heart size is  enlarged. There is no significant pericardial fluid, thickening or pericardial calcification. There is atherosclerosis of the thoracic aorta, the great vessels of the mediastinum and the coronary arteries, including calcified atherosclerotic plaque in the left anterior descending, left circumflex and right coronary arteries.  Mild thinning of the left ventricular myocardium in the region of the mid ventricular anteroseptal wall segment.  Calcified mediastinal and right hilar lymph nodes are incidentally noted. No pathologically enlarged mediastinal or hilar lymph nodes. There is a small hiatal hernia.  The left lobe of the thyroid gland is slightly enlarged and heterogeneous in appearance.  Lungs/Pleura: Small area of subsegmental atelectasis or scarring in the lateral segment of the right middle lobe.  The no acute consolidative air space disease.  No pleural effusions.  The no definite suspicious appearing pulmonary nodules or masses are identified.  Small area of  pleural-based scarring in the left apex.  Musculoskeletal: There are no aggressive appearing lytic or blastic lesions noted in the visualized portions of the skeleton.   Review of the MIP images confirms the above findings.  IMPRESSION: 1.  While there is very mild ectasia of the ascending thoracic aorta (4.2 cm in diameter), there are no findings to suggest acute aortic syndrome at this time.  2.  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  There is atherosclerosis, including at least three vessel coronary artery disease.  Assessment for potential risk factor modification, dietary therapy or pharmacologic therapy may be warranted, if clinically indicated. 3.  Small hiatal hernia. 4. The left lobe of the thyroid gland is  slightly enlarged and heterogeneous in appearance.  This is nonspecific, and likely reflects a goiter.  However, if there is clinical concern for thyroid disease or neoplasm, further evaluation with non emergent thyroid ultrasound may be warranted. 5.  Additional incidental findings, as above.  CTA ABDOMEN AND PELVIS  Findings:  Abdomen/Pelvis:  Precontrast images demonstrate no crescentic areas of high attenuation associated with the wall of the abdominal aorta to suggest acute intramural hemorrhage.  There is mild atherosclerosis of the abdominal and pelvic vasculature, without definite aneurysm or dissection.  The celiac axis, superior mesenteric artery and inferior mesenteric artery and their major branches are all widely patent.  There are several small nonobstructive calculi within the collecting systems of the kidneys bilaterally, largest of which measures up to 6 mm in the lower pole of the right kidney.  No ureteral calculi or findings to suggest urinary tract obstruction at this time.  Circumaortic left renal vein (a normal anatomical variant) is incidentally noted.  The left kidney has a altered axis (normal variant), which is unlikely to be related to any acute symptomatology.  Multiple low attenuation lesions in the kidneys bilaterally, the largest of which measures the largest of which measures 2.0 x 1.6 cm in the upper pole of the left kidney.  The larger of these lesions are all compatible with simple cysts, while the smaller lesions are too small to definitively characterize.  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  Status post cholecystectomy.  Small calcification in the liver likely represents a calcified granuloma.  No other focal hepatic lesions are identified.  The appearance of the pancreas, spleen and bilateral adrenal glands is unremarkable.  There are a few scattered colonic diverticula, without surrounding inflammatory changes to suggest an  acute diverticulitis at this time.  No ascites or pneumoperitoneum and no pathologic distension of bowel. No definite pathologic lymphadenopathy identified within the abdomen or pelvis.  Urinary bladder is unremarkable in appearance.  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  Additionally, at the L4-L5 level there is vacuum disc phenomenon, and there appears to be a large right sided lateral disc extrusion (with some vacuum disc phenomenon) extending into the medial aspect of the right psoas muscle.  A well-defined sclerotic lesion in the left ilium is favored to represent a small bone island.  No other aggressive appearing lytic or blastic lesions are noted in the visualized portions of the skeleton.   Review of the MIP images confirms the above findings.  IMPRESSION: 1. Mild atherosclerosis, but no acute findings of the abdominal aorta to account  for the patient's symptoms. 2.  There is multilevel degenerative disc disease and lumbar spondylosis, including vacuum disc phenomenon at the level of L4- L5, part of which extrudes into the medial aspect of the right psoas muscle.  This is new compared to prior examination from 11/05/2011, but the relationship of this finding to the patient's acute presentation is uncertain. 3.  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 4.  Status post cholecystectomy. 5.  Low attenuation renal lesions bilaterally, the largest of which are compatible with a simple cyst, while the smaller lesions are too small to definitively characterize. 6.  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 7.  Status post cholecystectomy. 8.  Additional incidental findings, as above.   Original Report Authenticated By: Florencia Reasons, M.D.     Cardiac Studies:  ECG:  Atrial flutter slow rate 32    Telemetry: Slow  atrial flutter 05/03/2012   Echo:   Medications:      . allopurinol  300 mg Oral Daily  . chlorthalidone  25 mg Oral Daily  . fenofibrate  160 mg Oral Daily  . insulin aspart  0-15 Units Subcutaneous TID WC  . insulin glargine  70 Units Subcutaneous QHS  . losartan  100 mg Oral Daily  . pantoprazole  40 mg Oral Q1200  . pregabalin  200 mg Oral TID  . simvastatin  40 mg Oral QPM  . sodium chloride  3 mL Intravenous Q12H  . traMADol  100 mg Oral QID  . Warfarin - Pharmacist Dosing Inpatient   Does not apply q1800       Assessment/Plan:  Back Pain: Improved CT with no aneurysm and multiple degenerative disc disease with previous lumbar surgery Flutter:  INR 2.37 resume normal home dose of coumadin  D/C with no beta blocker BS:  Continue pregabalin and check FBS Bradycardia:  Asymnptomatic.  HR increases to 80's with ambulation.  F/U [facility].  Discussed possible Need for pacer in future with patient. He prefers to F/U with his [facility] cardiologist   D/C home    Charlton Haws 05/03/2012, 7:22 AM

## 2012-10-07 ENCOUNTER — Encounter (HOSPITAL_BASED_OUTPATIENT_CLINIC_OR_DEPARTMENT_OTHER): Payer: Self-pay | Admitting: *Deleted

## 2012-10-07 ENCOUNTER — Emergency Department (HOSPITAL_BASED_OUTPATIENT_CLINIC_OR_DEPARTMENT_OTHER): Payer: Medicare Other

## 2012-10-07 ENCOUNTER — Emergency Department (HOSPITAL_BASED_OUTPATIENT_CLINIC_OR_DEPARTMENT_OTHER)
Admission: EM | Admit: 2012-10-07 | Discharge: 2012-10-07 | Disposition: A | Discharge status: Home / Self Care | Payer: Medicare Other | Attending: Emergency Medicine | Admitting: Emergency Medicine

## 2012-10-07 DIAGNOSIS — Z8679 Personal history of other diseases of the circulatory system: Secondary | ICD-10-CM | POA: Insufficient documentation

## 2012-10-07 DIAGNOSIS — Y9289 Other specified places as the place of occurrence of the external cause: Secondary | ICD-10-CM | POA: Insufficient documentation

## 2012-10-07 DIAGNOSIS — Z7901 Long term (current) use of anticoagulants: Secondary | ICD-10-CM | POA: Insufficient documentation

## 2012-10-07 DIAGNOSIS — Z8546 Personal history of malignant neoplasm of prostate: Secondary | ICD-10-CM | POA: Insufficient documentation

## 2012-10-07 DIAGNOSIS — Z8739 Personal history of other diseases of the musculoskeletal system and connective tissue: Secondary | ICD-10-CM | POA: Insufficient documentation

## 2012-10-07 DIAGNOSIS — M109 Gout, unspecified: Secondary | ICD-10-CM | POA: Insufficient documentation

## 2012-10-07 DIAGNOSIS — Z79899 Other long term (current) drug therapy: Secondary | ICD-10-CM | POA: Insufficient documentation

## 2012-10-07 DIAGNOSIS — Y939 Activity, unspecified: Secondary | ICD-10-CM | POA: Insufficient documentation

## 2012-10-07 DIAGNOSIS — I10 Essential (primary) hypertension: Secondary | ICD-10-CM | POA: Insufficient documentation

## 2012-10-07 DIAGNOSIS — E7800 Pure hypercholesterolemia, unspecified: Secondary | ICD-10-CM | POA: Insufficient documentation

## 2012-10-07 DIAGNOSIS — Z794 Long term (current) use of insulin: Secondary | ICD-10-CM | POA: Insufficient documentation

## 2012-10-07 DIAGNOSIS — S82009A Unspecified fracture of unspecified patella, initial encounter for closed fracture: Secondary | ICD-10-CM | POA: Insufficient documentation

## 2012-10-07 DIAGNOSIS — W010XXA Fall on same level from slipping, tripping and stumbling without subsequent striking against object, initial encounter: Secondary | ICD-10-CM | POA: Insufficient documentation

## 2012-10-07 DIAGNOSIS — T1490XA Injury, unspecified, initial encounter: Secondary | ICD-10-CM | POA: Insufficient documentation

## 2012-10-07 DIAGNOSIS — E119 Type 2 diabetes mellitus without complications: Secondary | ICD-10-CM | POA: Insufficient documentation

## 2012-10-07 DIAGNOSIS — Z87891 Personal history of nicotine dependence: Secondary | ICD-10-CM | POA: Insufficient documentation

## 2012-10-07 DIAGNOSIS — S20219A Contusion of unspecified front wall of thorax, initial encounter: Secondary | ICD-10-CM | POA: Insufficient documentation

## 2012-10-07 MED ORDER — OXYCODONE-ACETAMINOPHEN 5-325 MG PO TABS: 1.0000 | ORAL_TABLET | Freq: Four times a day (QID) | ORAL | Status: DC | PRN

## 2012-10-07 MED ORDER — HYDROMORPHONE HCL PF 1 MG/ML IJ SOLN
1.0000 mg | Freq: Once | INTRAMUSCULAR | Status: AC
  Administered 2012-10-07: 1 mg via INTRAMUSCULAR
  Filled 2012-10-07: qty 1

## 2012-10-07 MED ORDER — OXYCODONE-ACETAMINOPHEN 5-325 MG PO TABS
2.0000 | ORAL_TABLET | Freq: Once | ORAL | Status: AC
  Administered 2012-10-07: 2 via ORAL
  Filled 2012-10-07 (×2): qty 2

## 2012-10-07 NOTE — ED Provider Notes (Signed)
History     CSN: [id number]  Arrival date & time 10/07/12  1947   First MD Initiated Contact with Patient 10/07/12 2013      Chief Complaint  Patient presents with  . Fall    (Consider location/radiation/quality/duration/timing/severity/associated sxs/prior treatment) Patient is a 72 y.o. male presenting with fall.  Fall   Pt reports he slipped and fell earlier today landing on his L knee and R hand and also injuring his L chest wall. Complaining of severe aching pain to L knee worse with movement and moderate aching pain to L posterior ribs, worse with movement and deep breath. No head injury or LOC. No SOB or cough.   Past Medical History  Diagnosis Date  . Arthritis   . Gout   . Diabetes mellitus   . Hypertension   . High cholesterol   . Atrial flutter     was placed on coumadin   . Chronic anticoagulation   . Prostate cancer   . Bradycardia     Past Surgical History  Procedure Laterality Date  . Kidney stone surgery    . Thyroid surgery    . Kidney surgery    . Shoulder debridement      L and R  . Prostate surgery      had prostate cancer approx. 3 yrs ago  . Total knee arthroplasty      right  . Back surgery    . Joint replacement      No family history on file.  History  Substance Use Topics  . Smoking status: Former Games developer  . Smokeless tobacco: Never Used     Comment: Quit 35 years ago  . Alcohol Use: No      Review of Systems All other systems reviewed and are negative except as noted in HPI.   Allergies  Penicillins  Home Medications   Current Outpatient Rx  Name  Route  Sig  Dispense  Refill  . allopurinol (ZYLOPRIM) 300 MG tablet   Oral   Take 300 mg by mouth daily.         . chlorthalidone (HYGROTON) 25 MG tablet   Oral   Take 25 mg by mouth daily.         . fenofibrate 160 MG tablet   Oral   Take 160 mg by mouth daily.         . insulin glargine (LANTUS) 100 UNIT/ML injection   Subcutaneous   Inject 70 Units into  the skin at bedtime.          . insulin lispro (HUMALOG) 100 UNIT/ML injection   Subcutaneous   Inject 20 Units into the skin 3 (three) times daily before meals.          Marland Kitchen losartan-hydrochlorothiazide (HYZAAR) 100-12.5 MG per tablet   Oral   Take 1 tablet by mouth daily.         . metFORMIN (GLUCOPHAGE) 500 MG tablet   Oral   Take 1,000 mg by mouth 2 (two) times daily with a meal.         . omeprazole (PRILOSEC) 20 MG capsule   Oral   Take 40 mg by mouth daily.         . pregabalin (LYRICA) 100 MG capsule   Oral   Take 200 mg by mouth 3 (three) times daily.          . simvastatin (ZOCOR) 40 MG tablet   Oral   Take 40 mg by  mouth every evening.         . traMADol (ULTRAM) 50 MG tablet   Oral   Take 100 mg by mouth 4 (four) times daily.          Marland Kitchen warfarin (COUMADIN) 5 MG tablet   Oral   Take 7.5-10 mg by mouth daily. Take 10 MG on Mondays and Tuesdays; on Wednesday, Thursday, Friday, Saturday, and Sunday, take 7.5 MG           BP 152/83  Pulse 55  Temp(Src) 98.3 F (36.8 C) (Oral)  Resp 23  Ht 6' (1.829 m)  Wt 253 lb (114.76 kg)  BMI 34.31 kg/m2  SpO2 95%  Physical Exam  Nursing note and vitals reviewed. Constitutional: He is oriented to person, place, and time. He appears well-developed and well-nourished.  HENT:  Head: Normocephalic and atraumatic.  Eyes: EOM are normal. Pupils are equal, round, and reactive to light.  Neck: Normal range of motion. Neck supple.  Cardiovascular: Normal rate, normal heart sounds and intact distal pulses.   Pulmonary/Chest: Effort normal and breath sounds normal.  Abdominal: Bowel sounds are normal. He exhibits no distension. There is no tenderness.  Musculoskeletal: He exhibits tenderness (Large joint effusion of L knee, tender to palpation, unable to ROM due to pain; mild tenderness to palpation of L posterior lower ribs). He exhibits no edema.  Neurological: He is alert and oriented to person, place, and  time. He has normal strength. No cranial nerve deficit or sensory deficit.  Skin: Skin is warm and dry. No rash noted.  Psychiatric: He has a normal mood and affect.    ED Course  Procedures (including critical care time)  Labs Reviewed  PROTIME-INR - Abnormal; Notable for the following:    Prothrombin Time 24.7 (*)    INR 2.35 (*)    All other components within normal limits   Dg Ribs Unilateral W/chest Left  10/07/2012  *RADIOLOGY REPORT*  Clinical Data: Status post fall; left lower posterior rib pain.  LEFT RIBS AND CHEST - 3+ VIEW  Comparison: Chest radiograph performed 05/01/2012  Findings: No displaced rib fractures are seen. The site of the patient's pain is along the distal left posterior eleventh rib; this is not well characterized due to overlying structures, but a significant fracture is less likely at this location.  The lungs are well-aerated.  Vascular congestion is noted; this may be chronic in nature.  Mild left basilar opacity likely reflects atelectasis.  No pleural effusion or pneumothorax is seen.  The cardiomediastinal silhouette is borderline normal in size.  No acute osseous abnormalities are seen.  IMPRESSION:  1.  No definite displaced rib fractures seen. 2.  Chronic appearing vascular congestion noted; mild left basilar opacity likely reflects atelectasis.   Original Report Authenticated By: Tonia Ghent, M.D.    Dg Knee Complete 4 Views Left  10/07/2012  *RADIOLOGY REPORT*  Clinical Data: Fall.  Pain  LEFT KNEE - COMPLETE 4+ VIEW  Comparison: None.  Findings: Moderate sized joint effusion noted.  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  There is a longitudinal fracture through the mid portion of the patella.  Fracture fragments are only displaced by approximately 3 mm.  IMPRESSION:  1.  Longitudinal fracture through the patella. 2.  Joint effusion. 3.  Moderate to marked osteoarthritis peri   Original Report Authenticated By: Signa Kell, M.D.       No diagnosis found.    MDM  Xrays as above show patellar fracture.  Pain minimally improved with Percocet. Will give IM dilaudid, knee immobilizer and crutches. He has an orthopedist in [facility] he can follow with.        Briant Angelillo B. Bernette Mayers, MD 10/07/12 2205

## 2012-10-07 NOTE — ED Notes (Signed)
Slipped on ice today and fell. Pain in his left knee and left ribs.

## 2013-01-18 ENCOUNTER — Encounter (HOSPITAL_BASED_OUTPATIENT_CLINIC_OR_DEPARTMENT_OTHER): Payer: Self-pay | Admitting: Family Medicine

## 2013-01-18 ENCOUNTER — Emergency Department (HOSPITAL_BASED_OUTPATIENT_CLINIC_OR_DEPARTMENT_OTHER)
Admission: EM | Admit: 2013-01-18 | Discharge: 2013-01-18 | Disposition: A | Discharge status: Home / Self Care | Payer: Medicare Other | Attending: Emergency Medicine | Admitting: Emergency Medicine

## 2013-01-18 ENCOUNTER — Emergency Department (HOSPITAL_BASED_OUTPATIENT_CLINIC_OR_DEPARTMENT_OTHER): Payer: Medicare Other

## 2013-01-18 DIAGNOSIS — R6883 Chills (without fever): Secondary | ICD-10-CM | POA: Insufficient documentation

## 2013-01-18 DIAGNOSIS — R5381 Other malaise: Secondary | ICD-10-CM | POA: Insufficient documentation

## 2013-01-18 DIAGNOSIS — R0600 Dyspnea, unspecified: Secondary | ICD-10-CM

## 2013-01-18 DIAGNOSIS — R05 Cough: Secondary | ICD-10-CM | POA: Insufficient documentation

## 2013-01-18 DIAGNOSIS — Z7901 Long term (current) use of anticoagulants: Secondary | ICD-10-CM

## 2013-01-18 DIAGNOSIS — J40 Bronchitis, not specified as acute or chronic: Secondary | ICD-10-CM

## 2013-01-18 DIAGNOSIS — M109 Gout, unspecified: Secondary | ICD-10-CM | POA: Insufficient documentation

## 2013-01-18 DIAGNOSIS — I4891 Unspecified atrial fibrillation: Secondary | ICD-10-CM

## 2013-01-18 DIAGNOSIS — R5383 Other fatigue: Secondary | ICD-10-CM | POA: Insufficient documentation

## 2013-01-18 DIAGNOSIS — E119 Type 2 diabetes mellitus without complications: Secondary | ICD-10-CM | POA: Insufficient documentation

## 2013-01-18 DIAGNOSIS — Z880 Allergy status to penicillin: Secondary | ICD-10-CM | POA: Insufficient documentation

## 2013-01-18 DIAGNOSIS — Z79899 Other long term (current) drug therapy: Secondary | ICD-10-CM | POA: Insufficient documentation

## 2013-01-18 DIAGNOSIS — R0609 Other forms of dyspnea: Secondary | ICD-10-CM | POA: Insufficient documentation

## 2013-01-18 DIAGNOSIS — E7800 Pure hypercholesterolemia, unspecified: Secondary | ICD-10-CM | POA: Insufficient documentation

## 2013-01-18 DIAGNOSIS — R059 Cough, unspecified: Secondary | ICD-10-CM | POA: Insufficient documentation

## 2013-01-18 DIAGNOSIS — I10 Essential (primary) hypertension: Secondary | ICD-10-CM | POA: Insufficient documentation

## 2013-01-18 DIAGNOSIS — Z87891 Personal history of nicotine dependence: Secondary | ICD-10-CM | POA: Insufficient documentation

## 2013-01-18 DIAGNOSIS — Z8679 Personal history of other diseases of the circulatory system: Secondary | ICD-10-CM | POA: Insufficient documentation

## 2013-01-18 DIAGNOSIS — Z794 Long term (current) use of insulin: Secondary | ICD-10-CM | POA: Insufficient documentation

## 2013-01-18 DIAGNOSIS — R0989 Other specified symptoms and signs involving the circulatory and respiratory systems: Secondary | ICD-10-CM | POA: Insufficient documentation

## 2013-01-18 DIAGNOSIS — Z8546 Personal history of malignant neoplasm of prostate: Secondary | ICD-10-CM | POA: Insufficient documentation

## 2013-01-18 DIAGNOSIS — J209 Acute bronchitis, unspecified: Secondary | ICD-10-CM | POA: Insufficient documentation

## 2013-01-18 LAB — COMPREHENSIVE METABOLIC PANEL
ALT: 14 U/L (ref 0–53)
Albumin: 3.8 g/dL (ref 3.5–5.2)
Alkaline Phosphatase: 42 U/L (ref 39–117)
BUN: 25 mg/dL — ABNORMAL HIGH (ref 6–23)
Chloride: 100 mEq/L (ref 96–112)
Potassium: 3.6 mEq/L (ref 3.5–5.1)
Total Bilirubin: 0.7 mg/dL (ref 0.3–1.2)

## 2013-01-18 LAB — GLUCOSE, CAPILLARY: Glucose-Capillary: 125 mg/dL — ABNORMAL HIGH (ref 70–99)

## 2013-01-18 LAB — CBC WITH DIFFERENTIAL/PLATELET
Basophils Absolute: 0 10*3/uL (ref 0.0–0.1)
HCT: 48 % (ref 39.0–52.0)
Hemoglobin: 16 g/dL (ref 13.0–17.0)
Lymphocytes Relative: 20 % (ref 12–46)
Monocytes Absolute: 0.8 10*3/uL (ref 0.1–1.0)
Monocytes Relative: 9 % (ref 3–12)
Neutro Abs: 5.5 10*3/uL (ref 1.7–7.7)
RDW: 18.9 % — ABNORMAL HIGH (ref 11.5–15.5)
WBC: 8.1 10*3/uL (ref 4.0–10.5)

## 2013-01-18 LAB — PROTIME-INR: Prothrombin Time: 23.2 seconds — ABNORMAL HIGH (ref 11.6–15.2)

## 2013-01-18 MED ORDER — FUROSEMIDE 20 MG PO TABS: 20.0000 mg | ORAL_TABLET | Freq: Two times a day (BID) | ORAL | Status: AC

## 2013-01-18 MED ORDER — AZITHROMYCIN 250 MG PO TABS: 250.0000 mg | ORAL_TABLET | Freq: Every day | ORAL | Status: DC

## 2013-01-18 MED ORDER — ZOLPIDEM TARTRATE 5 MG PO TABS: 5.0000 mg | ORAL_TABLET | Freq: Every evening | ORAL | Status: AC | PRN

## 2013-01-18 NOTE — ED Notes (Addendum)
Pt c/o "difficulty breathing" and chest pain with deep inspiration. Pt also c/o cough. Family member with pt sts pt has been anxious and dealing with death of 2 sons recently.

## 2013-01-18 NOTE — ED Provider Notes (Addendum)
History     CSN: [id number]  Arrival date & time 01/18/13  0915   First MD Initiated Contact with Patient 01/18/13 [phone]      Chief Complaint  Patient presents with  . Shortness of Breath    (Consider location/radiation/quality/duration/timing/severity/associated sxs/prior treatment) HPI Comments: Patient with history of Afib presents with complaints of a two-day history of cough, chest congestion, chills.  He denies any ill contacts.  No fevers.  Denies chest pain but does feel short of breath.    I have been informed by a family member that the patient recently had two sons pass away due to cancer.  He has been under a lot of stress due to this.    Patient is a 72 y.o. male presenting with shortness of breath. The history is provided by the patient.  Shortness of Breath Severity:  Moderate Onset quality:  Sudden Duration:  2 days Timing:  Constant Progression:  Worsening Chronicity:  New Context: URI   Relieved by:  Nothing Worsened by:  Nothing tried Ineffective treatments:  None tried Associated symptoms: cough   Associated symptoms: no chest pain and no fever     Past Medical History  Diagnosis Date  . Arthritis   . Gout   . Diabetes mellitus   . Hypertension   . High cholesterol   . Atrial flutter     was placed on coumadin   . Chronic anticoagulation   . Prostate cancer   . Bradycardia     Past Surgical History  Procedure Laterality Date  . Kidney stone surgery    . Thyroid surgery    . Kidney surgery    . Shoulder debridement      L and R  . Prostate surgery      had prostate cancer approx. 3 yrs ago  . Total knee arthroplasty      right  . Back surgery    . Joint replacement      No family history on file.  History  Substance Use Topics  . Smoking status: Former Games developer  . Smokeless tobacco: Never Used     Comment: Quit 35 years ago  . Alcohol Use: No      Review of Systems  Constitutional: Positive for chills and fatigue. Negative  for fever, activity change and appetite change.  Respiratory: Positive for cough and shortness of breath.   Cardiovascular: Negative for chest pain and leg swelling.  All other systems reviewed and are negative.    Allergies  Penicillins  Home Medications   Current Outpatient Rx  Name  Route  Sig  Dispense  Refill  . allopurinol (ZYLOPRIM) 300 MG tablet   Oral   Take 300 mg by mouth daily.         . chlorthalidone (HYGROTON) 25 MG tablet   Oral   Take 25 mg by mouth daily.         . fenofibrate 160 MG tablet   Oral   Take 160 mg by mouth daily.         . insulin glargine (LANTUS) 100 UNIT/ML injection   Subcutaneous   Inject 70 Units into the skin at bedtime.          . insulin lispro (HUMALOG) 100 UNIT/ML injection   Subcutaneous   Inject 20 Units into the skin 3 (three) times daily before meals.          Marland Kitchen losartan-hydrochlorothiazide (HYZAAR) 100-12.5 MG per tablet   Oral  Take 1 tablet by mouth daily.         . metFORMIN (GLUCOPHAGE) 500 MG tablet   Oral   Take 1,000 mg by mouth 2 (two) times daily with a meal.         . omeprazole (PRILOSEC) 20 MG capsule   Oral   Take 40 mg by mouth daily.         Marland Kitchen oxyCODONE-acetaminophen (PERCOCET/ROXICET) 5-325 MG per tablet   Oral   Take 1-2 tablets by mouth every 6 (six) hours as needed for pain.   20 tablet   0   . pregabalin (LYRICA) 100 MG capsule   Oral   Take 200 mg by mouth 3 (three) times daily.          . simvastatin (ZOCOR) 40 MG tablet   Oral   Take 40 mg by mouth every evening.         . traMADol (ULTRAM) 50 MG tablet   Oral   Take 100 mg by mouth 4 (four) times daily.          Marland Kitchen warfarin (COUMADIN) 5 MG tablet   Oral   Take 7.5-10 mg by mouth daily. Take 10 MG on Mondays and Tuesdays; on Wednesday, Thursday, Friday, Saturday, and Sunday, take 7.5 MG           BP 134/81  Pulse 61  Temp(Src) 97.7 F (36.5 C) (Oral)  Resp 26  SpO2 99%  Physical Exam  Nursing  note and vitals reviewed. Constitutional: He is oriented to person, place, and time. He appears well-developed and well-nourished. No distress.  HENT:  Head: Normocephalic and atraumatic.  Mouth/Throat: Oropharynx is clear and moist.  Neck: Normal range of motion. Neck supple.  Cardiovascular: Normal rate.   No murmur heard. The heart rate is irregular.  Pulmonary/Chest: Effort normal and breath sounds normal. No respiratory distress. He has no wheezes.  Abdominal: Soft. Bowel sounds are normal. He exhibits no distension. There is no tenderness.  Musculoskeletal: Normal range of motion. He exhibits no edema.  Lymphadenopathy:    He has no cervical adenopathy.  Neurological: He is alert and oriented to person, place, and time.  Skin: Skin is warm and dry. He is not diaphoretic.    ED Course  Procedures (including critical care time)  Labs Reviewed  GLUCOSE, CAPILLARY - Abnormal; Notable for the following:    Glucose-Capillary 125 (*)    All other components within normal limits  CBC WITH DIFFERENTIAL  TROPONIN I  COMPREHENSIVE METABOLIC PANEL  PROTIME-INR  PRO B NATRIURETIC PEPTIDE   No results found.   No diagnosis found.   Date: 01/18/2013  Rate: 54  Rhythm: atrial fibrillation  QRS Axis: normal  Intervals: normal  ST/T Wave abnormalities: nonspecific T wave changes  Conduction Disutrbances:none  Narrative Interpretation:   Old EKG Reviewed: unchanged    MDM  The patient presents with complaints of feeling short of breath and is coughing up yellow sputum.  The workup does not show a pneumonia, and I have a low suspicion for pe based on the fact he is already on coumadin for atrial fibrillation.  He is to see his cardiologist in the near future about a cardioversion.  The only abnormality in the labs is an elevated bnp, however there is no overt evidence for heart failure on the exam or chest xray.  I will treat with an antibiotic and ensure that he follows up with  his pcp and cardiologist in the near  future.   I will also add a small dose of lasix due to the elevated bnp.  The daughter in law has also requested a sleep aid as he has been unable to sleep due to the death of his sons.  I have also written for 10 ambien.        Geoffery Lyons, MD 01/18/13 1106  Geoffery Lyons, MD 01/18/13 1124

## 2013-01-28 ENCOUNTER — Emergency Department (HOSPITAL_BASED_OUTPATIENT_CLINIC_OR_DEPARTMENT_OTHER): Payer: Medicare Other

## 2013-01-28 ENCOUNTER — Emergency Department (HOSPITAL_BASED_OUTPATIENT_CLINIC_OR_DEPARTMENT_OTHER)
Admission: EM | Admit: 2013-01-28 | Discharge: 2013-01-28 | Disposition: A | Discharge status: Home / Self Care | Payer: Medicare Other | Attending: Emergency Medicine | Admitting: Emergency Medicine

## 2013-01-28 ENCOUNTER — Encounter (HOSPITAL_BASED_OUTPATIENT_CLINIC_OR_DEPARTMENT_OTHER): Payer: Self-pay

## 2013-01-28 DIAGNOSIS — Z880 Allergy status to penicillin: Secondary | ICD-10-CM | POA: Insufficient documentation

## 2013-01-28 DIAGNOSIS — I10 Essential (primary) hypertension: Secondary | ICD-10-CM | POA: Insufficient documentation

## 2013-01-28 DIAGNOSIS — Z79899 Other long term (current) drug therapy: Secondary | ICD-10-CM | POA: Insufficient documentation

## 2013-01-28 DIAGNOSIS — E785 Hyperlipidemia, unspecified: Secondary | ICD-10-CM | POA: Insufficient documentation

## 2013-01-28 DIAGNOSIS — K5900 Constipation, unspecified: Secondary | ICD-10-CM | POA: Insufficient documentation

## 2013-01-28 DIAGNOSIS — M109 Gout, unspecified: Secondary | ICD-10-CM | POA: Insufficient documentation

## 2013-01-28 DIAGNOSIS — R109 Unspecified abdominal pain: Secondary | ICD-10-CM | POA: Insufficient documentation

## 2013-01-28 DIAGNOSIS — Z7901 Long term (current) use of anticoagulants: Secondary | ICD-10-CM | POA: Insufficient documentation

## 2013-01-28 DIAGNOSIS — Z8546 Personal history of malignant neoplasm of prostate: Secondary | ICD-10-CM | POA: Insufficient documentation

## 2013-01-28 DIAGNOSIS — I4892 Unspecified atrial flutter: Secondary | ICD-10-CM | POA: Insufficient documentation

## 2013-01-28 DIAGNOSIS — R21 Rash and other nonspecific skin eruption: Secondary | ICD-10-CM | POA: Insufficient documentation

## 2013-01-28 DIAGNOSIS — Z8679 Personal history of other diseases of the circulatory system: Secondary | ICD-10-CM | POA: Insufficient documentation

## 2013-01-28 DIAGNOSIS — Z87891 Personal history of nicotine dependence: Secondary | ICD-10-CM | POA: Insufficient documentation

## 2013-01-28 DIAGNOSIS — E119 Type 2 diabetes mellitus without complications: Secondary | ICD-10-CM | POA: Insufficient documentation

## 2013-01-28 LAB — COMPREHENSIVE METABOLIC PANEL
BUN: 30 mg/dL — ABNORMAL HIGH (ref 6–23)
CO2: 32 mEq/L (ref 19–32)
Calcium: 10 mg/dL (ref 8.4–10.5)
GFR calc Af Amer: 85 mL/min — ABNORMAL LOW (ref >90)
GFR calc non Af Amer: 74 mL/min — ABNORMAL LOW (ref >90)
Glucose, Bld: 185 mg/dL — ABNORMAL HIGH (ref 70–99)
Total Protein: 7.3 g/dL (ref 6.0–8.3)

## 2013-01-28 LAB — CBC WITH DIFFERENTIAL/PLATELET
Eosinophils Absolute: 0.2 10*3/uL (ref 0.0–0.7)
Eosinophils Relative: 1 % (ref 0–5)
HCT: 50.6 % (ref 39.0–52.0)
Hemoglobin: 16.9 g/dL (ref 13.0–17.0)
Lymphs Abs: 2.1 10*3/uL (ref 0.7–4.0)
MCH: 27.7 pg (ref 26.0–34.0)
MCV: 82.8 fL (ref 78.0–100.0)
Monocytes Absolute: 0.9 10*3/uL (ref 0.1–1.0)
Monocytes Relative: 7 % (ref 3–12)
RBC: 6.11 MIL/uL — ABNORMAL HIGH (ref 4.22–5.81)

## 2013-01-28 LAB — LIPASE, BLOOD: Lipase: 46 U/L (ref 11–59)

## 2013-01-28 MED ORDER — IOHEXOL 300 MG/ML  SOLN
50.0000 mL | Freq: Once | INTRAMUSCULAR | Status: AC | PRN
  Administered 2013-01-28: 50 mL via ORAL

## 2013-01-28 MED ORDER — HYDROCODONE-ACETAMINOPHEN 5-325 MG PO TABS: 1.0000 | ORAL_TABLET | Freq: Four times a day (QID) | ORAL | Status: DC | PRN

## 2013-01-28 MED ORDER — SODIUM CHLORIDE 0.9 % IV BOLUS (SEPSIS)
250.0000 mL | Freq: Once | INTRAVENOUS | Status: AC
  Administered 2013-01-28: 250 mL via INTRAVENOUS

## 2013-01-28 MED ORDER — DOCUSATE SODIUM 100 MG PO CAPS: 100.0000 mg | ORAL_CAPSULE | Freq: Two times a day (BID) | ORAL | Status: DC

## 2013-01-28 MED ORDER — HYDROMORPHONE HCL PF 1 MG/ML IJ SOLN
0.5000 mg | Freq: Once | INTRAMUSCULAR | Status: AC
  Administered 2013-01-28: 0.5 mg via INTRAVENOUS
  Filled 2013-01-28: qty 1

## 2013-01-28 MED ORDER — POLYETHYLENE GLYCOL 3350 17 G PO PACK: 17.0000 g | PACK | Freq: Every day | ORAL | Status: AC

## 2013-01-28 MED ORDER — IOHEXOL 300 MG/ML  SOLN
125.0000 mL | Freq: Once | INTRAMUSCULAR | Status: AC | PRN
  Administered 2013-01-28: 125 mL via INTRAVENOUS

## 2013-01-28 MED ORDER — MAGNESIUM CITRATE PO SOLN: 296.0000 mL | Freq: Once | ORAL | Status: DC

## 2013-01-28 MED ORDER — ONDANSETRON HCL 4 MG/2ML IJ SOLN
4.0000 mg | Freq: Once | INTRAMUSCULAR | Status: AC
  Administered 2013-01-28: 4 mg via INTRAVENOUS
  Filled 2013-01-28: qty 2

## 2013-01-28 MED ORDER — SODIUM CHLORIDE 0.9 % IV SOLN: INTRAVENOUS | Status: DC

## 2013-01-28 NOTE — ED Provider Notes (Signed)
History     CSN: [id number]  Arrival date & time 01/28/13  1100   First MD Initiated Contact with Patient 01/28/13 1109      Chief Complaint  Patient presents with  . Constipation    (Consider location/radiation/quality/duration/timing/severity/associated sxs/prior treatment) Patient is a 72 y.o. male presenting with constipation. The history is provided by the patient.  Constipation Associated symptoms: abdominal pain   Associated symptoms: no back pain, no dysuria, no fever, no nausea and no vomiting    Patient with history of constipation in the past but more severe for the past 3 days no bowel movement. This morning I was having strong urges to go was having difficulty in stool output to get some out. Patient accidentally remove some of the stool itself. No bleeding. No nausea no vomiting no fever. Rectal pain when trying to have a bowel movement was 10 out of 10 patient still with some discomfort but improving. Patient is on tramadol for general pain may be contributing to the constipation. Past Medical History  Diagnosis Date  . Arthritis   . Gout   . Diabetes mellitus   . Hypertension   . High cholesterol   . Atrial flutter     was placed on coumadin   . Chronic anticoagulation   . Prostate cancer   . Bradycardia     Past Surgical History  Procedure Laterality Date  . Kidney stone surgery    . Thyroid surgery    . Kidney surgery    . Shoulder debridement      L and R  . Prostate surgery      had prostate cancer approx. 3 yrs ago  . Total knee arthroplasty      right  . Back surgery    . Joint replacement      History reviewed. No pertinent family history.  History  Substance Use Topics  . Smoking status: Former Games developer  . Smokeless tobacco: Never Used     Comment: Quit 35 years ago  . Alcohol Use: No      Review of Systems  Constitutional: Negative for fever.  HENT: Negative for congestion.   Eyes: Negative for visual disturbance.  Respiratory:  Negative for shortness of breath.   Gastrointestinal: Positive for abdominal pain and constipation. Negative for nausea and vomiting.  Genitourinary: Negative for dysuria.  Musculoskeletal: Negative for back pain.  Skin: Positive for rash.  Hematological: Does not bruise/bleed easily.  Psychiatric/Behavioral: Negative for confusion.    Allergies  Penicillins  Home Medications   Current Outpatient Rx  Name  Route  Sig  Dispense  Refill  . allopurinol (ZYLOPRIM) 300 MG tablet   Oral   Take 300 mg by mouth daily.         Marland Kitchen azithromycin (ZITHROMAX) 250 MG tablet   Oral   Take 1 tablet (250 mg total) by mouth daily. Take first 2 tablets together, then 1 every day until finished.   6 tablet   0   . chlorthalidone (HYGROTON) 25 MG tablet   Oral   Take 25 mg by mouth daily.         . fenofibrate 160 MG tablet   Oral   Take 160 mg by mouth daily.         . furosemide (LASIX) 20 MG tablet   Oral   Take 1 tablet (20 mg total) by mouth 2 (two) times daily.   7 tablet   0   . insulin glargine (LANTUS)  100 UNIT/ML injection   Subcutaneous   Inject 70 Units into the skin at bedtime.          . insulin lispro (HUMALOG) 100 UNIT/ML injection   Subcutaneous   Inject 20 Units into the skin 3 (three) times daily before meals.          Marland Kitchen losartan-hydrochlorothiazide (HYZAAR) 100-12.5 MG per tablet   Oral   Take 1 tablet by mouth daily.         . metFORMIN (GLUCOPHAGE) 500 MG tablet   Oral   Take 1,000 mg by mouth 2 (two) times daily with a meal.         . omeprazole (PRILOSEC) 20 MG capsule   Oral   Take 40 mg by mouth daily.         Marland Kitchen oxyCODONE-acetaminophen (PERCOCET/ROXICET) 5-325 MG per tablet   Oral   Take 1-2 tablets by mouth every 6 (six) hours as needed for pain.   20 tablet   0   . pregabalin (LYRICA) 100 MG capsule   Oral   Take 200 mg by mouth 3 (three) times daily.          . simvastatin (ZOCOR) 40 MG tablet   Oral   Take 40 mg by mouth  every evening.         . traMADol (ULTRAM) 50 MG tablet   Oral   Take 100 mg by mouth 4 (four) times daily.          Marland Kitchen warfarin (COUMADIN) 5 MG tablet   Oral   Take 7.5-10 mg by mouth daily. Take 10 MG on Mondays and Tuesdays; on Wednesday, Thursday, Friday, Saturday, and Sunday, take 7.5 MG         . zolpidem (AMBIEN) 5 MG tablet   Oral   Take 1 tablet (5 mg total) by mouth at bedtime as needed for sleep.   10 tablet   0     BP 110/71  Pulse 59  Temp(Src) 97.5 F (36.4 C) (Oral)  Resp 18  Ht 6' (1.829 m)  Wt 250 lb (113.399 kg)  BMI 33.9 kg/m2  SpO2 94%  Physical Exam  Nursing note and vitals reviewed. Constitutional: He is oriented to person, place, and time. He appears well-developed and well-nourished. No distress.  HENT:  Head: Normocephalic and atraumatic.  Mouth/Throat: Oropharynx is clear and moist.  Eyes: Conjunctivae and EOM are normal. Pupils are equal, round, and reactive to light.  Neck: Normal range of motion.  Cardiovascular: Normal rate and regular rhythm.   Pulmonary/Chest: Effort normal and breath sounds normal.  Abdominal: Soft. Bowel sounds are normal. There is no tenderness.  Genitourinary: Rectum normal.  Rectal exam without evidence of external hemorrhoids or fissures. No prolapsed internal hemorrhoids. Rectal stool large amount but soft no evidence of impaction.  Musculoskeletal: Normal range of motion.  Neurological: He is alert and oriented to person, place, and time. No cranial nerve deficit. He exhibits abnormal muscle tone. Coordination normal.  Skin: Skin is warm. No rash noted.    ED Course  Procedures (including critical care time)  Labs Reviewed  CBC WITH DIFFERENTIAL - Abnormal; Notable for the following:    WBC 13.7 (*)    RBC 6.11 (*)    RDW 19.2 (*)    Neutro Abs 10.5 (*)    All other components within normal limits  COMPREHENSIVE METABOLIC PANEL - Abnormal; Notable for the following:    Glucose, Bld 185 (*)  BUN  30 (*)    GFR calc non Af Amer 74 (*)    GFR calc Af Amer 85 (*)    All other components within normal limits  LIPASE, BLOOD   Ct Abdomen Pelvis W Contrast  01/28/2013   *RADIOLOGY REPORT*  Clinical Data: Constipation.  Mid abdominal pain.  No bowel movement for 4 days.  History of cholecystectomy, prostate cancer.  CT ABDOMEN AND PELVIS WITH CONTRAST  Technique:  Multidetector CT imaging of the abdomen and pelvis was performed following the standard protocol during bolus administration of intravenous contrast.  Contrast: 50mL OMNIPAQUE IOHEXOL 300 MG/ML  SOLN, OMNIPAQUE IOHEXOL 300 MG/ML  SOLN  Comparison: CT of the abdomen pelvis 05/01/2012  Findings: The heart is enlarged.  There are coronary artery calcifications.  Note is made of hiatal hernia.  Patient has had prior cholecystectomy.  No focal abnormality is identified within the liver, spleen, pancreas, or adrenal glands. Small low attenuation lesions within the kidneys bilaterally are consistent with cysts.  The stomach, small bowel loops are normal in caliber and wall thickness.  Note is made of a duodenal diverticulum involving the descending portion of the duodenum. The appendix is well seen and has a normal appearance.  There is significant fecal impaction involving the rectosigmoid colon.  Stool is seen throughout colonic loops.  There are numerous colonic diverticula.  No evidence for acute diverticulitis.No retroperitoneal or mesenteric adenopathy. No evidence for aortic aneurysm.  There are significant degenerative changes throughout the lower thoracic and lumbar spine.  No suspicious lytic or blastic lesions are identified.  Degenerative changes are also noted in both hips.  IMPRESSION:  1.  Significant stool burden with fecal impaction involving the rectosigmoid colon. 2.  Diverticulosis without evidence for acute diverticulitis. 3.  Status post cholecystectomy. 4.  Small, bilateral renal cysts. 5.  Hiatal hernia. 6.  Cardiomegaly and  coronary artery calcifications.   Original Report Authenticated By: Norva Pavlov, M.D.   Results for orders placed during the hospital encounter of 01/28/13  CBC WITH DIFFERENTIAL      Result Value Range   WBC 13.7 (*) 4.0 - 10.5 K/uL   RBC 6.11 (*) 4.22 - 5.81 MIL/uL   Hemoglobin 16.9  13.0 - 17.0 g/dL   HCT 16.1  09.6 - 04.5 %   MCV 82.8  78.0 - 100.0 fL   MCH 27.7  26.0 - 34.0 pg   MCHC 33.4  30.0 - 36.0 g/dL   RDW 40.9 (*) 81.1 - 91.4 %   Platelets 214  150 - 400 K/uL   Neutrophils Relative % 77  43 - 77 %   Neutro Abs 10.5 (*) 1.7 - 7.7 K/uL   Lymphocytes Relative 15  12 - 46 %   Lymphs Abs 2.1  0.7 - 4.0 K/uL   Monocytes Relative 7  3 - 12 %   Monocytes Absolute 0.9  0.1 - 1.0 K/uL   Eosinophils Relative 1  0 - 5 %   Eosinophils Absolute 0.2  0.0 - 0.7 K/uL   Basophils Relative 0  0 - 1 %   Basophils Absolute 0.0  0.0 - 0.1 K/uL  COMPREHENSIVE METABOLIC PANEL      Result Value Range   Sodium 143  135 - 145 mEq/L   Potassium 4.5  3.5 - 5.1 mEq/L   Chloride 102  96 - 112 mEq/L   CO2 32  19 - 32 mEq/L   Glucose, Bld 185 (*) 70 - 99 mg/dL  BUN 30 (*) 6 - 23 mg/dL   Creatinine, Ser 3.08  0.50 - 1.35 mg/dL   Calcium 65.7  8.4 - 84.6 mg/dL   Total Protein 7.3  6.0 - 8.3 g/dL   Albumin 4.1  3.5 - 5.2 g/dL   AST 31  0 - 37 U/L   ALT 14  0 - 53 U/L   Alkaline Phosphatase 51  39 - 117 U/L   Total Bilirubin 0.4  0.3 - 1.2 mg/dL   GFR calc non Af Amer 74 (*) >90 mL/min   GFR calc Af Amer 85 (*) >90 mL/min  LIPASE, BLOOD      Result Value Range   Lipase 46  11 - 59 U/L     1. Constipation       MDM   rectal exam was done. Large amount of stool in the rectal vault but it was soft not hard. CT scan shows large stool volume makes mention of impaction the basal rectal exam not severely impacted. The patient feeling better in the emergency apartment after pain medicines. We'll discharge home and have him take Colace on a regular basis adding MiraLAX as needed. And then  not given prescription for magnesium citrate if the others did not help.        Shelda Jakes, MD 01/28/13 [phone]

## 2013-01-28 NOTE — ED Notes (Signed)
Pt states that he has hemorrhoids and has been suffering with constipation for the past few days.  Pt states that the pain has become more severe this morning and came in for Eval.

## 2013-08-12 ENCOUNTER — Encounter (HOSPITAL_BASED_OUTPATIENT_CLINIC_OR_DEPARTMENT_OTHER): Payer: Self-pay | Admitting: Emergency Medicine

## 2013-08-12 ENCOUNTER — Emergency Department (HOSPITAL_BASED_OUTPATIENT_CLINIC_OR_DEPARTMENT_OTHER): Payer: Medicare Other

## 2013-08-12 ENCOUNTER — Emergency Department (HOSPITAL_BASED_OUTPATIENT_CLINIC_OR_DEPARTMENT_OTHER)
Admission: EM | Admit: 2013-08-12 | Discharge: 2013-08-12 | Disposition: A | Discharge status: Home / Self Care | Payer: Medicare Other | Attending: Emergency Medicine | Admitting: Emergency Medicine

## 2013-08-12 DIAGNOSIS — Z794 Long term (current) use of insulin: Secondary | ICD-10-CM | POA: Insufficient documentation

## 2013-08-12 DIAGNOSIS — Z87442 Personal history of urinary calculi: Secondary | ICD-10-CM | POA: Insufficient documentation

## 2013-08-12 DIAGNOSIS — E119 Type 2 diabetes mellitus without complications: Secondary | ICD-10-CM | POA: Insufficient documentation

## 2013-08-12 DIAGNOSIS — M549 Dorsalgia, unspecified: Secondary | ICD-10-CM

## 2013-08-12 DIAGNOSIS — M25519 Pain in unspecified shoulder: Secondary | ICD-10-CM | POA: Insufficient documentation

## 2013-08-12 DIAGNOSIS — Z8546 Personal history of malignant neoplasm of prostate: Secondary | ICD-10-CM | POA: Insufficient documentation

## 2013-08-12 DIAGNOSIS — M109 Gout, unspecified: Secondary | ICD-10-CM | POA: Insufficient documentation

## 2013-08-12 DIAGNOSIS — M129 Arthropathy, unspecified: Secondary | ICD-10-CM | POA: Insufficient documentation

## 2013-08-12 DIAGNOSIS — I10 Essential (primary) hypertension: Secondary | ICD-10-CM | POA: Insufficient documentation

## 2013-08-12 DIAGNOSIS — E7800 Pure hypercholesterolemia, unspecified: Secondary | ICD-10-CM | POA: Insufficient documentation

## 2013-08-12 DIAGNOSIS — Z7901 Long term (current) use of anticoagulants: Secondary | ICD-10-CM | POA: Insufficient documentation

## 2013-08-12 DIAGNOSIS — R0989 Other specified symptoms and signs involving the circulatory and respiratory systems: Secondary | ICD-10-CM | POA: Insufficient documentation

## 2013-08-12 DIAGNOSIS — Z880 Allergy status to penicillin: Secondary | ICD-10-CM | POA: Insufficient documentation

## 2013-08-12 DIAGNOSIS — Z87891 Personal history of nicotine dependence: Secondary | ICD-10-CM | POA: Insufficient documentation

## 2013-08-12 DIAGNOSIS — Z79899 Other long term (current) drug therapy: Secondary | ICD-10-CM | POA: Insufficient documentation

## 2013-08-12 DIAGNOSIS — M546 Pain in thoracic spine: Secondary | ICD-10-CM | POA: Insufficient documentation

## 2013-08-12 HISTORY — DX: Calculus of kidney: N20.0

## 2013-08-12 LAB — BASIC METABOLIC PANEL
GFR calc non Af Amer: 84 mL/min — ABNORMAL LOW (ref >90)
Glucose, Bld: 146 mg/dL — ABNORMAL HIGH (ref 70–99)
Potassium: 3.7 mEq/L (ref 3.5–5.1)
Sodium: 143 mEq/L (ref 135–145)

## 2013-08-12 LAB — PROTIME-INR: INR: 2.44 — ABNORMAL HIGH (ref 0.00–1.49)

## 2013-08-12 MED ORDER — OXYCODONE-ACETAMINOPHEN 5-325 MG PO TABS: 1.0000 | ORAL_TABLET | ORAL | Status: DC | PRN

## 2013-08-12 MED ORDER — FUROSEMIDE 40 MG PO TABS
40.0000 mg | ORAL_TABLET | Freq: Once | ORAL | Status: AC
  Administered 2013-08-12: 40 mg via ORAL
  Filled 2013-08-12: qty 1

## 2013-08-12 MED ORDER — MORPHINE SULFATE 2 MG/ML IJ SOLN
INTRAMUSCULAR | Status: AC
  Filled 2013-08-12: qty 1

## 2013-08-12 MED ORDER — MORPHINE SULFATE 4 MG/ML IJ SOLN
INTRAMUSCULAR | Status: AC
  Filled 2013-08-12: qty 1

## 2013-08-12 MED ORDER — ONDANSETRON 4 MG PO TBDP
4.0000 mg | ORAL_TABLET | Freq: Once | ORAL | Status: AC
  Administered 2013-08-12: 4 mg via ORAL
  Filled 2013-08-12: qty 1

## 2013-08-12 MED ORDER — MORPHINE SULFATE 4 MG/ML IJ SOLN
6.0000 mg | Freq: Once | INTRAMUSCULAR | Status: AC
  Administered 2013-08-12: 6 mg via INTRAMUSCULAR
  Filled 2013-08-12: qty 2

## 2013-08-12 MED ORDER — MORPHINE SULFATE 4 MG/ML IJ SOLN
6.0000 mg | Freq: Once | INTRAMUSCULAR | Status: AC
  Administered 2013-08-12: 6 mg via INTRAMUSCULAR

## 2013-08-12 NOTE — ED Notes (Signed)
Pt having ongoing left shoulder and back pain for over one month.  Has been seen x 2 by MD and was told he had some "bone on bone" in his back.

## 2013-08-12 NOTE — ED Provider Notes (Signed)
CSN: [id number]     Arrival date & time 08/12/13  0909 History   First MD Initiated Contact with Patient 08/12/13 [phone]     Chief Complaint  Patient presents with  . Shoulder Pain  . Back Pain   (Consider location/radiation/quality/duration/timing/severity/associated sxs/prior Treatment) HPI Comments: Patient has a 2 and half month history of pain in his left upper back. It's under his left shoulder blade. He describes as an aching pain it is otherwise nonradiating. It's worse with movement. It's severe when he tries to sit up in bed. He denies any radiation down his arms. He denies any numbness or weakness in his arms. The pain does get worse when he takes deep breaths but he denies any shortness of breath. He states the pain is essentially been unchanged for the last 2 and half months. He saw a urgent care Center about 2 months ago and had a chest x-ray done which she was told was normal except for arthritis changes in his back. He was given prednisone as well as some pain medication and muscle relaxers to use. It felt better for a little time after that. He also saw a [facility] physician about a week and half ago. He has not given any new medications at that point. He's been taking his Coumadin regularly. 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  Patient is a 72 y.o. male presenting with shoulder pain and back pain.  Shoulder Pain Pertinent negatives include no chest pain, no abdominal pain, no headaches and no shortness of breath.  Back Pain Associated symptoms: no abdominal pain, no chest pain, no fever, no headaches, no numbness and no weakness     Past Medical History  Diagnosis Date  . Arthritis   . Gout   . Diabetes mellitus   . Hypertension   . High cholesterol   . Atrial flutter     was placed on coumadin   . Chronic anticoagulation   . Prostate cancer   . Bradycardia   . Kidney stones    Past Surgical History   Procedure Laterality Date  . Kidney stone surgery    . Thyroid surgery    . Kidney surgery    . Shoulder debridement      L and R  . Prostate surgery      had prostate cancer approx. 3 yrs ago  . Total knee arthroplasty      right  . Back surgery    . Joint replacement     No family history on file. History  Substance Use Topics  . Smoking status: Former Games developer  . Smokeless tobacco: Never Used     Comment: Quit 35 years ago  . Alcohol Use: No    Review of Systems  Constitutional: Negative for fever, chills, diaphoresis and fatigue.  HENT: Negative for congestion, rhinorrhea and sneezing.   Eyes: Negative.   Respiratory: Negative for cough, chest tightness and shortness of breath.   Cardiovascular: Negative for chest pain and leg swelling.  Gastrointestinal: Negative for nausea, vomiting, abdominal pain, diarrhea and blood in stool.  Genitourinary: Negative for frequency, hematuria, flank pain and difficulty urinating.  Musculoskeletal: Positive for back pain. Negative for arthralgias.  Skin: Negative for rash.  Neurological: Negative for dizziness, speech difficulty, weakness, numbness and headaches.    Allergies  Penicillins  Home Medications   Current Outpatient Rx  Name  Route  Sig  Dispense  Refill  . allopurinol (ZYLOPRIM) 300 MG tablet   Oral   Take 300 mg by mouth daily.         . chlorthalidone (HYGROTON) 25 MG tablet   Oral   Take 25 mg by mouth daily.         . fenofibrate 160 MG tablet   Oral   Take 160 mg by mouth daily.         . furosemide (LASIX) 20 MG tablet   Oral   Take 1 tablet (20 mg total) by mouth 2 (two) times daily.   7 tablet   0   . insulin glargine (LANTUS) 100 UNIT/ML injection   Subcutaneous   Inject 70 Units into the skin at bedtime.          . insulin lispro (HUMALOG) 100 UNIT/ML injection   Subcutaneous   Inject 20 Units into the skin 3 (three) times daily before meals.          Marland Kitchen  losartan-hydrochlorothiazide (HYZAAR) 100-12.5 MG per tablet   Oral   Take 1 tablet by mouth daily.         . metFORMIN (GLUCOPHAGE) 500 MG tablet   Oral   Take 1,000 mg by mouth 2 (two) times daily with a meal.         . omeprazole (PRILOSEC) 20 MG capsule   Oral   Take 40 mg by mouth daily.         Marland Kitchen oxyCODONE-acetaminophen (PERCOCET) 5-325 MG per tablet   Oral   Take 1 tablet by mouth every 4 (four) hours as needed.   20 tablet   0   . polyethylene glycol (MIRALAX / GLYCOLAX) packet   Oral   Take 17 g by mouth daily.   14 each   0   . pregabalin (LYRICA) 100 MG capsule   Oral   Take 200 mg by mouth 3 (three) times daily.          . simvastatin (ZOCOR) 40 MG tablet   Oral   Take 40 mg by mouth every evening.         . traMADol (ULTRAM) 50 MG tablet   Oral   Take 100 mg by mouth 4 (four) times daily.          Marland Kitchen warfarin (COUMADIN) 5 MG tablet   Oral   Take 7.5-10 mg by mouth daily. Take 10 MG on Mondays and Tuesdays; on Wednesday, Thursday, Friday, Saturday, and Sunday, take 7.5 MG         . zolpidem (AMBIEN) 5 MG tablet   Oral   Take 1 tablet (5 mg total) by mouth at bedtime as needed for sleep.   10 tablet   0    BP 144/68  Pulse 41  Temp(Src) 97.5 F (36.4 C) (Oral)  Resp 18  Ht 6' (1.829 m)  Wt 252 lb (114.306 kg)  BMI 34.17 kg/m2  SpO2 95% Physical Exam  Constitutional: He is oriented to person, place, and time. He appears well-developed and well-nourished.  HENT:  Head: Normocephalic and atraumatic.  Mouth/Throat: Oropharynx is clear and moist.  Eyes: Pupils are equal, round, and reactive to light.  Neck: Normal range of motion. Neck supple.  Cardiovascular: Normal rate, regular rhythm and normal heart sounds.   Pulmonary/Chest: Effort normal. No respiratory distress. He has no wheezes. He has rales (Scarce crackles bilaterally). He exhibits no tenderness.  Abdominal: Soft. Bowel sounds are normal. There is no tenderness. There is  no rebound and no guarding.  Musculoskeletal: Normal range of motion. He exhibits no edema.  Patient has point tenderness along the musculature of the left upper back. There's no bony tenderness along the spine. There is no pain to the scapula. There is no rash or erythema to the area. No pedal edema or calf tenderness  Lymphadenopathy:    He has no cervical adenopathy.  Neurological: He is alert and oriented to person, place, and time.  Radial pulses are symmetric in both arms. He has normal sensation in both hands. Normal motor function in both upper extremities.  Skin: Skin is warm and dry. No rash noted.  Psychiatric: He has a normal mood and affect.    ED Course  Procedures (including critical care time) Labs Review Labs Reviewed  PROTIME-INR - Abnormal; Notable for the following:    Prothrombin Time 25.7 (*)    INR 2.44 (*)    All other components within normal limits  BASIC METABOLIC PANEL - Abnormal; Notable for the following:    Glucose, Bld 146 (*)    BUN 28 (*)    GFR calc non Af Amer 84 (*)    All other components within normal limits   Imaging Review Dg Chest 2 View  08/12/2013   CLINICAL DATA:  Left upper back pain, left shoulder pain.  EXAM: CHEST  2 VIEW  COMPARISON:  01/18/2013  FINDINGS: Cardiomegaly with vascular congestion. No overt edema. No confluent airspace opacity or effusion. No acute bony abnormality. Degenerative changes in the shoulders and thoracic spine.  IMPRESSION: Cardiomegaly with vascular congestion.   Electronically Signed   By: Charlett Nose M.D.   On: 08/12/2013 10:10    EKG Interpretation    Date/Time:  Saturday August 12 2013 10:30:11 EST Ventricular Rate:  45 PR Interval:    QRS Duration: 88 QT Interval:  500 QTC Calculation: 432 R Axis:   53 Text Interpretation:  Atrial fibrillation with slow ventricular response Cannot rule out Anterior infarct , age undetermined T wave abnormality, consider inferolateral ischemia Abnormal ECG since  last tracing no significant change Confirmed by Blayne Garlick  MD, Miliyah Luper ([id number]) on 08/12/2013 2:08:30 PM            MDM   1. Back pain    Patient has point tenderness to the musculature of the left upper back. This could represent I nerve impingement. He has no shortness of breath or other symptoms to be more suggestive of pulmonary embolus. His Coumadin is therapeutic and given this I feel that he does have a low suspicion for pulmonary embolus. I feel this is likely musculoskeletal. There's no evidence of pneumonia or pneumothorax. There was some vascular congestion which was mild on the chest x-ray. He did also have some crackles on exam. He however denies any shortness of breath. There's no hypoxia. He is on Lasix at home and I gave him an extra dose here in the ED. He denies any chest pain or tightness it would be concerning for acute coronary syndrome. I encouraged him to followup with his primary care provider. He was given a shot of morphine in the ED as well as Decadron. He was given a prescription for Percocet to use at home for the pain.    Rolan Bucco, MD 08/12/13 [phone]

## 2014-04-10 ENCOUNTER — Emergency Department (HOSPITAL_BASED_OUTPATIENT_CLINIC_OR_DEPARTMENT_OTHER): Payer: Medicare Other

## 2014-04-10 ENCOUNTER — Emergency Department (HOSPITAL_BASED_OUTPATIENT_CLINIC_OR_DEPARTMENT_OTHER)
Admission: EM | Admit: 2014-04-10 | Discharge: 2014-04-10 | Disposition: A | Discharge status: Home / Self Care | Payer: Medicare Other | Attending: Emergency Medicine | Admitting: Emergency Medicine

## 2014-04-10 ENCOUNTER — Encounter (HOSPITAL_BASED_OUTPATIENT_CLINIC_OR_DEPARTMENT_OTHER): Payer: Self-pay | Admitting: Emergency Medicine

## 2014-04-10 DIAGNOSIS — I10 Essential (primary) hypertension: Secondary | ICD-10-CM | POA: Insufficient documentation

## 2014-04-10 DIAGNOSIS — Z8546 Personal history of malignant neoplasm of prostate: Secondary | ICD-10-CM | POA: Diagnosis not present

## 2014-04-10 DIAGNOSIS — M109 Gout, unspecified: Secondary | ICD-10-CM | POA: Insufficient documentation

## 2014-04-10 DIAGNOSIS — Z794 Long term (current) use of insulin: Secondary | ICD-10-CM | POA: Insufficient documentation

## 2014-04-10 DIAGNOSIS — IMO0002 Reserved for concepts with insufficient information to code with codable children: Secondary | ICD-10-CM | POA: Insufficient documentation

## 2014-04-10 DIAGNOSIS — Y929 Unspecified place or not applicable: Secondary | ICD-10-CM | POA: Insufficient documentation

## 2014-04-10 DIAGNOSIS — R296 Repeated falls: Secondary | ICD-10-CM | POA: Diagnosis not present

## 2014-04-10 DIAGNOSIS — E7800 Pure hypercholesterolemia, unspecified: Secondary | ICD-10-CM | POA: Diagnosis not present

## 2014-04-10 DIAGNOSIS — S5000XA Contusion of unspecified elbow, initial encounter: Secondary | ICD-10-CM | POA: Diagnosis not present

## 2014-04-10 DIAGNOSIS — Z880 Allergy status to penicillin: Secondary | ICD-10-CM | POA: Diagnosis not present

## 2014-04-10 DIAGNOSIS — S6990XA Unspecified injury of unspecified wrist, hand and finger(s), initial encounter: Secondary | ICD-10-CM

## 2014-04-10 DIAGNOSIS — Z79899 Other long term (current) drug therapy: Secondary | ICD-10-CM | POA: Insufficient documentation

## 2014-04-10 DIAGNOSIS — Z7901 Long term (current) use of anticoagulants: Secondary | ICD-10-CM | POA: Insufficient documentation

## 2014-04-10 DIAGNOSIS — Z87891 Personal history of nicotine dependence: Secondary | ICD-10-CM | POA: Diagnosis not present

## 2014-04-10 DIAGNOSIS — Z87442 Personal history of urinary calculi: Secondary | ICD-10-CM | POA: Diagnosis not present

## 2014-04-10 DIAGNOSIS — E119 Type 2 diabetes mellitus without complications: Secondary | ICD-10-CM | POA: Insufficient documentation

## 2014-04-10 DIAGNOSIS — Y9389 Activity, other specified: Secondary | ICD-10-CM | POA: Insufficient documentation

## 2014-04-10 DIAGNOSIS — I4892 Unspecified atrial flutter: Secondary | ICD-10-CM | POA: Insufficient documentation

## 2014-04-10 DIAGNOSIS — S59909A Unspecified injury of unspecified elbow, initial encounter: Secondary | ICD-10-CM | POA: Insufficient documentation

## 2014-04-10 DIAGNOSIS — S59919A Unspecified injury of unspecified forearm, initial encounter: Secondary | ICD-10-CM | POA: Diagnosis present

## 2014-04-10 DIAGNOSIS — S5001XA Contusion of right elbow, initial encounter: Secondary | ICD-10-CM

## 2014-04-10 LAB — PROTIME-INR
INR: 2.39 — ABNORMAL HIGH (ref 0.00–1.49)
Prothrombin Time: 26.1 seconds — ABNORMAL HIGH (ref 11.6–15.2)

## 2014-04-10 MED ORDER — HYDROMORPHONE HCL PF 1 MG/ML IJ SOLN
0.5000 mg | Freq: Once | INTRAMUSCULAR | Status: AC
  Administered 2014-04-10: 0.5 mg via INTRAVENOUS
  Filled 2014-04-10: qty 1

## 2014-04-10 MED ORDER — ONDANSETRON HCL 4 MG/2ML IJ SOLN
4.0000 mg | Freq: Once | INTRAMUSCULAR | Status: AC
  Administered 2014-04-10: 4 mg via INTRAVENOUS
  Filled 2014-04-10: qty 2

## 2014-04-10 MED ORDER — SODIUM CHLORIDE 0.9 % IV SOLN
INTRAVENOUS | Status: DC
Start: 2014-04-10 — End: 2014-04-10
  Administered 2014-04-10: 08:00:00 via INTRAVENOUS

## 2014-04-10 MED ORDER — HYDROMORPHONE HCL PF 1 MG/ML IJ SOLN
1.0000 mg | Freq: Once | INTRAMUSCULAR | Status: AC
  Administered 2014-04-10: 1 mg via INTRAVENOUS
  Filled 2014-04-10: qty 1

## 2014-04-10 MED ORDER — HYDROCODONE-ACETAMINOPHEN 5-325 MG PO TABS: 1.0000 | ORAL_TABLET | Freq: Four times a day (QID) | ORAL | Status: AC | PRN

## 2014-04-10 NOTE — ED Notes (Signed)
MD at bedside. 

## 2014-04-10 NOTE — ED Notes (Signed)
Pt fell Sunday pm  Has abrasion to nose and c/o of rt elbow pain

## 2014-04-10 NOTE — ED Provider Notes (Signed)
CSN: [id number]     Arrival date & time 04/10/14  3500 History   First MD Initiated Contact with Patient 04/10/14 [phone]     Chief Complaint  Patient presents with  . Fall     (Consider location/radiation/quality/duration/timing/severity/associated sxs/prior Treatment) Patient is a 73 y.o. male presenting with fall. The history is provided by the patient.  Fall Pertinent negatives include no chest pain, no abdominal pain, no headaches and no shortness of breath.   patient status post fall on Sunday no loss of consciousness. Patient injured his right elbow and an abrasion to his nose from his glasses. On Monday started at pain in the right elbow which got significantly worse today. Patient is on Coumadin. Denies any other injuries. The elbow pain is 10 out of 10 located just at the elbow is no wrist or shoulder pain.  Past Medical History  Diagnosis Date  . Arthritis   . Gout   . Diabetes mellitus   . Hypertension   . High cholesterol   . Atrial flutter     was placed on coumadin   . Chronic anticoagulation   . Prostate cancer   . Bradycardia   . Kidney stones    Past Surgical History  Procedure Laterality Date  . Kidney stone surgery    . Thyroid surgery    . Kidney surgery    . Shoulder debridement      L and R  . Prostate surgery      had prostate cancer approx. 3 yrs ago  . Total knee arthroplasty      right  . Back surgery    . Joint replacement     No family history on file. History  Substance Use Topics  . Smoking status: Former Research scientist (life sciences)  . Smokeless tobacco: Never Used     Comment: Quit 35 years ago  . Alcohol Use: No    Review of Systems  Constitutional: Negative for fever.  HENT: Negative for congestion.   Eyes: Negative for visual disturbance.  Respiratory: Negative for shortness of breath.   Cardiovascular: Negative for chest pain.  Gastrointestinal: Negative for abdominal pain.  Genitourinary: Negative for hematuria.  Musculoskeletal: Positive for  arthralgias. Negative for back pain and neck pain.  Skin: Positive for wound. Negative for rash.  Neurological: Negative for headaches.  Hematological: Bruises/bleeds easily.  Psychiatric/Behavioral: Negative for confusion.      Allergies  Penicillins  Home Medications   Prior to Admission medications   Medication Sig Start Date End Date Taking? Authorizing Provider  allopurinol (ZYLOPRIM) 300 MG tablet Take 300 mg by mouth daily.    Historical Provider, MD  chlorthalidone (HYGROTON) 25 MG tablet Take 25 mg by mouth daily.    Historical Provider, MD  fenofibrate 160 MG tablet Take 160 mg by mouth daily.    Historical Provider, MD  furosemide (LASIX) 20 MG tablet Take 1 tablet (20 mg total) by mouth 2 (two) times daily. 01/18/13   Veryl Speak, MD  HYDROcodone-acetaminophen (NORCO/VICODIN) 5-325 MG per tablet Take 1-2 tablets by mouth every 6 (six) hours as needed for moderate pain. 04/10/14   Fredia Sorrow, MD  insulin glargine (LANTUS) 100 UNIT/ML injection Inject 70 Units into the skin at bedtime.     Historical Provider, MD  insulin lispro (HUMALOG) 100 UNIT/ML injection Inject 20 Units into the skin 3 (three) times daily before meals.     Historical Provider, MD  losartan-hydrochlorothiazide (HYZAAR) 100-12.5 MG per tablet Take 1 tablet by mouth daily.  Historical Provider, MD  metFORMIN (GLUCOPHAGE) 500 MG tablet Take 1,000 mg by mouth 2 (two) times daily with a meal.    Historical Provider, MD  omeprazole (PRILOSEC) 20 MG capsule Take 40 mg by mouth daily.    Historical Provider, MD  oxyCODONE-acetaminophen (PERCOCET) 5-325 MG per tablet Take 1 tablet by mouth every 4 (four) hours as needed. 08/12/13   Malvin Johns, MD  polyethylene glycol (MIRALAX / GLYCOLAX) packet Take 17 g by mouth daily. 01/28/13   Fredia Sorrow, MD  pregabalin (LYRICA) 100 MG capsule Take 200 mg by mouth 3 (three) times daily.     Historical Provider, MD  simvastatin (ZOCOR) 40 MG tablet Take 40 mg by  mouth every evening.    Historical Provider, MD  traMADol (ULTRAM) 50 MG tablet Take 100 mg by mouth 4 (four) times daily.     Historical Provider, MD  warfarin (COUMADIN) 5 MG tablet Take 7.5-10 mg by mouth daily. Take 10 MG on Mondays and Tuesdays; on Wednesday, Thursday, Friday, Saturday, and Sunday, take 7.5 MG    Historical Provider, MD  zolpidem (AMBIEN) 5 MG tablet Take 1 tablet (5 mg total) by mouth at bedtime as needed for sleep. 01/18/13   Veryl Speak, MD   BP 150/76  Pulse 60  Temp(Src) 98.7 F (37.1 C) (Oral)  Resp 24  Wt 250 lb (113.399 kg)  SpO2 93% Physical Exam  Nursing note and vitals reviewed. Constitutional: He is oriented to person, place, and time. He appears well-developed and well-nourished. He appears distressed.  HENT:  Head: Normocephalic.  Mouth/Throat: Oropharynx is clear and moist.  Superficial abrasion to nose no active bleeding no significant deformity  Eyes: Conjunctivae and EOM are normal. Pupils are equal, round, and reactive to light.  Neck: Normal range of motion. Neck supple.  Cardiovascular: Normal rate, regular rhythm and normal heart sounds.   No murmur heard. Pulmonary/Chest: Effort normal and breath sounds normal. No respiratory distress.  Abdominal: Soft. Bowel sounds are normal. There is no tenderness.  Musculoskeletal: Normal range of motion. He exhibits tenderness.  Tenderness to palpation to right elbow no deformity. Distally neurovascular intact. No tenderness at the shoulder or at the wrist.  Neurological: He is alert and oriented to person, place, and time. No cranial nerve deficit. He exhibits normal muscle tone. Coordination normal.  Skin: Skin is warm. No rash noted.    ED Course  Procedures (including critical care time) Labs Review Labs Reviewed  PROTIME-INR - Abnormal; Notable for the following:    Prothrombin Time 26.1 (*)    INR 2.39 (*)    All other components within normal limits    Imaging Review Dg Elbow Complete  Right  04/10/2014   CLINICAL DATA:  Pain post trauma  EXAM: RIGHT ELBOW - COMPLETE 3+ VIEW  COMPARISON:  Study obtained earlier in the day  FINDINGS: Frontal, lateral, and bilateral oblique views were obtained. There is no appreciable fracture, dislocation, or effusion. There is advanced osteoarthritic change with extensive spurring and joint space narrowing. There is no erosive change or bony destruction. There is a linear metallic foreign body located posterior and medial to the distal humerus.  IMPRESSION: Extensive osteoarthritic change. No demonstrable fracture or dislocation. No appreciable joint effusion. Linear metallic foreign body located posterior and medial to the distal humeral metaphysis. Question needle fragment.   Electronically Signed   By: Lowella Grip M.D.   On: 04/10/2014 09:43   Dg Elbow Complete Right  04/10/2014   CLINICAL DATA:  Fall.  EXAM: RIGHT ELBOW - COMPLETE 3+ VIEW  COMPARISON:  None.  FINDINGS: Limited exam due to difficulty in patient positioning. Elbow joint effusion cannot be excluded. Severe degenerative changes present about the right elbow with prominent osteophytes. Rounded corticated bony densities are noted about the elbow. These may represent loose bodies. Old fracture fragments could also present in this fashion. Although a definite fracture is not identified due to the severity of the degenerative change present and inability of the patient to extend his forearm, a subtle fracture would be difficult to detect. If symptoms persist a repeat right elbow series with better positioning should be considered. If this is not possible MRI of the right elbow can be obtained.  IMPRESSION: 1. Limited exam due to difficulty in patient positioning. 2. Severe degenerative changes right elbow. Multiple loose bodies are most likely present. Due to the difficulty in positioning the patient and extensive degenerative change, a subtle fracture would be difficult to detect. If  symptoms persist a repeat right elbow series with better positioning should be considered. If this is not possible MRI of the right elbow can be obtained.   Electronically Signed   By: Marcello Moores  Register   On: 04/10/2014 07:37     EKG Interpretation None      MDM   Final diagnoses:  Elbow contusion, right, initial encounter    Patient with a fall on Sunday started with elbow pain on Monday got significantly worse today. No bony injury identified. Patient with pain control and emergency apartment improvement was swelling. Patient's INR is therapeutic. Patient will be referred to sports medicine. No neuro deficit no significant bony injury or fracture seen on x-ray.    Fredia Sorrow, MD 04/10/14 1032

## 2014-04-10 NOTE — ED Notes (Signed)
Report received, assessment deferred, pt is in xray.

## 2014-04-10 NOTE — Discharge Instructions (Signed)
X-ray of the right elbow without any bony injury. Use laying as needed for comfort. Followup with sports medicine referral provided. Take pain medication as needed.

## 2014-04-10 NOTE — ED Notes (Signed)
Fell Sunday pm due to new glasses  Abrasion to nose,  On Monday elbow started hurting

## 2015-09-04 ENCOUNTER — Emergency Department (HOSPITAL_BASED_OUTPATIENT_CLINIC_OR_DEPARTMENT_OTHER)
Admission: EM | Admit: 2015-09-04 | Discharge: 2015-09-04 | Disposition: A | Discharge status: Home / Self Care | Payer: Medicare Other | Attending: Physician Assistant | Admitting: Physician Assistant

## 2015-09-04 ENCOUNTER — Encounter (HOSPITAL_BASED_OUTPATIENT_CLINIC_OR_DEPARTMENT_OTHER): Payer: Self-pay

## 2015-09-04 DIAGNOSIS — Z794 Long term (current) use of insulin: Secondary | ICD-10-CM | POA: Insufficient documentation

## 2015-09-04 DIAGNOSIS — Z7984 Long term (current) use of oral hypoglycemic drugs: Secondary | ICD-10-CM | POA: Diagnosis not present

## 2015-09-04 DIAGNOSIS — E119 Type 2 diabetes mellitus without complications: Secondary | ICD-10-CM | POA: Insufficient documentation

## 2015-09-04 DIAGNOSIS — Z880 Allergy status to penicillin: Secondary | ICD-10-CM | POA: Insufficient documentation

## 2015-09-04 DIAGNOSIS — Z7901 Long term (current) use of anticoagulants: Secondary | ICD-10-CM | POA: Diagnosis not present

## 2015-09-04 DIAGNOSIS — Z87891 Personal history of nicotine dependence: Secondary | ICD-10-CM | POA: Insufficient documentation

## 2015-09-04 DIAGNOSIS — E7800 Pure hypercholesterolemia, unspecified: Secondary | ICD-10-CM | POA: Diagnosis not present

## 2015-09-04 DIAGNOSIS — B001 Herpesviral vesicular dermatitis: Secondary | ICD-10-CM | POA: Diagnosis not present

## 2015-09-04 DIAGNOSIS — Z8546 Personal history of malignant neoplasm of prostate: Secondary | ICD-10-CM | POA: Diagnosis not present

## 2015-09-04 DIAGNOSIS — Z79899 Other long term (current) drug therapy: Secondary | ICD-10-CM | POA: Insufficient documentation

## 2015-09-04 DIAGNOSIS — Z87442 Personal history of urinary calculi: Secondary | ICD-10-CM | POA: Insufficient documentation

## 2015-09-04 DIAGNOSIS — M109 Gout, unspecified: Secondary | ICD-10-CM | POA: Diagnosis not present

## 2015-09-04 DIAGNOSIS — M1990 Unspecified osteoarthritis, unspecified site: Secondary | ICD-10-CM | POA: Diagnosis not present

## 2015-09-04 DIAGNOSIS — K1379 Other lesions of oral mucosa: Secondary | ICD-10-CM | POA: Diagnosis present

## 2015-09-04 NOTE — ED Notes (Signed)
D/c home with ride 

## 2015-09-04 NOTE — ED Notes (Signed)
C/o cold sore to bottom lip x 7-10 days-was seen at urgent care last Friday started on meds (acyclovir, gabapentin)-was given steroid injection and pain injection-per pt)-no better-NAD-steady gait

## 2015-09-04 NOTE — ED Provider Notes (Signed)
CSN: [id number]     Arrival date & time 09/04/15  1607 History   First MD Initiated Contact with Patient 09/04/15 1704     Chief Complaint  Patient presents with  . Mouth Lesions     (Consider location/radiation/quality/duration/timing/severity/associated sxs/prior Treatment) HPI Comments: Patient presents to the emergency department with chief complaint of cold sore. He states that he was treated last Friday for the same. He was given Valtrex and gabapentin as well as a steroid shot. Patient states the symptoms started 7-10 days ago. He denies any fevers or chills. States that his lip has improved some, but because of the persistent symptoms he wanted to be reevaluated. He states that the pain is aggravated when he wakes in the morning and his lips are start together. Denies any other symptoms at this time.  The history is provided by the patient. No language interpreter was used.    Past Medical History  Diagnosis Date  . Arthritis   . Gout   . Diabetes mellitus   . Hypertension   . High cholesterol   . Atrial flutter ([place])     was placed on coumadin   . Chronic anticoagulation   . Prostate cancer ([place])   . Bradycardia   . Kidney stones    Past Surgical History  Procedure Laterality Date  . Kidney stone surgery    . Thyroid surgery    . Kidney surgery    . Shoulder debridement      L and R  . Prostate surgery      had prostate cancer approx. 3 yrs ago  . Total knee arthroplasty      right  . Back surgery    . Joint replacement     No family history on file. Social History  Substance Use Topics  . Smoking status: Former Research scientist (life sciences)  . Smokeless tobacco: Never Used     Comment: Quit 35 years ago  . Alcohol Use: No    Review of Systems  Constitutional: Negative for fever and chills.  Respiratory: Negative for shortness of breath.   Cardiovascular: Negative for chest pain.  Gastrointestinal: Negative for nausea, vomiting, diarrhea and constipation.  Genitourinary:  Negative for dysuria.  Skin: Positive for wound.      Allergies  Penicillins  Home Medications   Prior to Admission medications   Medication Sig Start Date End Date Taking? Authorizing Provider  gabapentin (NEURONTIN) 300 MG capsule Take 300 mg by mouth 3 (three) times daily.   Yes Historical Provider, MD  allopurinol (ZYLOPRIM) 300 MG tablet Take 300 mg by mouth daily.    Historical Provider, MD  chlorthalidone (HYGROTON) 25 MG tablet Take 25 mg by mouth daily.    Historical Provider, MD  fenofibrate 160 MG tablet Take 160 mg by mouth daily.    Historical Provider, MD  furosemide (LASIX) 20 MG tablet Take 1 tablet (20 mg total) by mouth 2 (two) times daily. 01/18/13   Veryl Speak, MD  HYDROcodone-acetaminophen (NORCO/VICODIN) 5-325 MG per tablet Take 1-2 tablets by mouth every 6 (six) hours as needed for moderate pain. 04/10/14   Fredia Sorrow, MD  insulin glargine (LANTUS) 100 UNIT/ML injection Inject 70 Units into the skin at bedtime.     Historical Provider, MD  insulin lispro (HUMALOG) 100 UNIT/ML injection Inject 20 Units into the skin 3 (three) times daily before meals.     Historical Provider, MD  losartan-hydrochlorothiazide (HYZAAR) 100-12.5 MG per tablet Take 1 tablet by mouth daily.  Historical Provider, MD  metFORMIN (GLUCOPHAGE) 500 MG tablet Take 1,000 mg by mouth 2 (two) times daily with a meal.    Historical Provider, MD  omeprazole (PRILOSEC) 20 MG capsule Take 40 mg by mouth daily.    Historical Provider, MD  oxyCODONE-acetaminophen (PERCOCET) 5-325 MG per tablet Take 1 tablet by mouth every 4 (four) hours as needed. 08/12/13   Malvin Johns, MD  polyethylene glycol (MIRALAX / GLYCOLAX) packet Take 17 g by mouth daily. 01/28/13   Fredia Sorrow, MD  pregabalin (LYRICA) 100 MG capsule Take 200 mg by mouth 3 (three) times daily.     Historical Provider, MD  simvastatin (ZOCOR) 40 MG tablet Take 40 mg by mouth every evening.    Historical Provider, MD  traMADol (ULTRAM)  50 MG tablet Take 100 mg by mouth 4 (four) times daily.     Historical Provider, MD  warfarin (COUMADIN) 5 MG tablet Take 7.5-10 mg by mouth daily. Take 10 MG on Mondays and Tuesdays; on Wednesday, Thursday, Friday, Saturday, and Sunday, take 7.5 MG    Historical Provider, MD  zolpidem (AMBIEN) 5 MG tablet Take 1 tablet (5 mg total) by mouth at bedtime as needed for sleep. 01/18/13   Veryl Speak, MD   BP 155/87 mmHg  Pulse 60  Temp(Src) 98.5 F (36.9 C) (Oral)  Resp 20  Ht 6' (1.829 m)  Wt 112.492 kg  BMI 33.63 kg/m2  SpO2 95% Physical Exam  Constitutional: He is oriented to person, place, and time. He appears well-developed and well-nourished.  HENT:  Head: Normocephalic and atraumatic.  Cold sore left lower lip, no abscess, no cellulitis  Eyes: Conjunctivae and EOM are normal.  Neck: Normal range of motion.  Cardiovascular: Normal rate.   Pulmonary/Chest: Effort normal.  Abdominal: He exhibits no distension.  Musculoskeletal: Normal range of motion.  Neurological: He is alert and oriented to person, place, and time.  Skin: Skin is dry.  Psychiatric: He has a normal mood and affect. His behavior is normal. Judgment and thought content normal.  Nursing note and vitals reviewed.   ED Course  Procedures (including critical care time)   MDM   Final diagnoses:  Cold sore    Treated last Friday with Valtrex and gabapentin and given a steroid shot. Advised patient that his symptoms will likely persist week or so. No evidence of infection. Patient is stable to discharge.    Montine Circle, PA-C 09/04/15 [place], MD 09/05/15 [id number]

## 2015-09-04 NOTE — ED Notes (Signed)
PA at bedside.

## 2015-09-04 NOTE — Discharge Instructions (Signed)
Try Abreva, you can get this over the counter.  Cold Sore A cold sore (fever blister) is a skin infection caused by the herpes simplex virus (HSV-1). HSV-1 is closely related to the virus that causes genital herpes (HSV-2), but they are not the same even though both viruses can cause oral and genital infections. Cold sores are small, fluid-filled sores inside of the mouth or on the lips, gums, nose, chin, cheeks, or fingers.  The herpes simplex virus can be easily passed (contagious) to other people through close personal contact, such as kissing or sharing personal items. The virus can also spread to other parts of the body, such as the eyes or genitals. Cold sores are contagious until the sores crust over completely. They often heal within 2 weeks.  Once a person is infected, the herpes simplex virus remains permanently in the body. Therefore, there is no cure for cold sores, and they often recur when a person is tired, stressed, sick, or gets too much sun. Additional factors that can cause a recurrence include hormone changes in menstruation or pregnancy, certain drugs, and cold weather.  CAUSES  Cold sores are caused by the herpes simplex virus. The virus is spread from person to person through close contact, such as through kissing, touching the affected area, or sharing personal items such as lip balm, razors, or eating utensils.  SYMPTOMS  The first infection may not cause symptoms. If symptoms develop, the symptoms often go through different stages. Here is how a cold sore develops:   Tingling, itching, or burning is felt 1-2 days before the outbreak.   Fluid-filled blisters appear on the lips, inside the mouth, nose, or on the cheeks.   The blisters start to ooze clear fluid.   The blisters dry up and a yellow crust appears in its place.   The crust falls off.  Symptoms depend on whether it is the initial outbreak or a recurrence. Some other symptoms with the first outbreak may  include:   Fever.   Sore throat.   Headache.   Muscle aches.   Swollen neck glands.  DIAGNOSIS  A diagnosis is often made based on your symptoms and looking at the sores. Sometimes, a sore may be swabbed and then examined in the lab to make a final diagnosis. If the sores are not present, blood tests can find the herpes simplex virus.  TREATMENT  There is no cure for cold sores and no vaccine for the herpes simplex virus. Within 2 weeks, most cold sores go away on their own without treatment. Medicines cannot make the infection go away, but medicine can help relieve some of the pain associated with the sores, can work to stop the virus from multiplying, and can also shorten healing time. Medicine may be in the form of creams, gels, pills, or a shot.  HOME CARE INSTRUCTIONS   Only take over-the-counter or prescription medicines for pain, discomfort, or fever as directed by your caregiver. Do not use aspirin.   Use a cotton-tip swab to apply creams or gels to your sores.   Do not touch the sores or pick the scabs. Wash your hands often. Do not touch your eyes without washing your hands first.   Avoid kissing, oral sex, and sharing personal items until sores heal.   Apply an ice pack on your sores for 10-15 minutes to ease any discomfort.   Avoid hot, cold, or salty foods because they may hurt your mouth. Eat a soft, bland diet  to avoid irritating the sores. Use a straw to drink if you have pain when drinking out of a glass.   Keep sores clean and dry to prevent an infection of other tissues.   Avoid the sun and limit stress if these things trigger outbreaks. If sun causes cold sores, apply sunscreen on the lips before being out in the sun.  SEEK MEDICAL CARE IF:   You have a fever or persistent symptoms for more than 2-3 days.   You have a fever and your symptoms suddenly get worse.   You have pus, not clear fluid, coming from the sores.   You have redness that  is spreading.   You have pain or irritation in your eye.   You get sores on your genitals.   Your sores do not heal within 2 weeks.   You have a weakened immune system.   You have frequent recurrences of cold sores.  MAKE SURE YOU:   Understand these instructions.  Will watch your condition.  Will get help right away if you are not doing well or get worse.   This information is not intended to replace advice given to you by your health care provider. Make sure you discuss any questions you have with your health care provider.   Document Released: 08/07/2000 Document Revised: 08/31/2014 Document Reviewed: 12/23/2011 Elsevier Interactive Patient Education [employer].

## 2015-11-09 ENCOUNTER — Emergency Department (HOSPITAL_BASED_OUTPATIENT_CLINIC_OR_DEPARTMENT_OTHER)
Admission: EM | Admit: 2015-11-09 | Discharge: 2015-11-09 | Disposition: A | Discharge status: Home / Self Care | Payer: Medicare Other | Attending: Emergency Medicine | Admitting: Emergency Medicine

## 2015-11-09 ENCOUNTER — Encounter (HOSPITAL_BASED_OUTPATIENT_CLINIC_OR_DEPARTMENT_OTHER): Payer: Self-pay | Admitting: *Deleted

## 2015-11-09 DIAGNOSIS — E7800 Pure hypercholesterolemia, unspecified: Secondary | ICD-10-CM | POA: Insufficient documentation

## 2015-11-09 DIAGNOSIS — Z880 Allergy status to penicillin: Secondary | ICD-10-CM | POA: Insufficient documentation

## 2015-11-09 DIAGNOSIS — Z794 Long term (current) use of insulin: Secondary | ICD-10-CM | POA: Insufficient documentation

## 2015-11-09 DIAGNOSIS — Z791 Long term (current) use of non-steroidal anti-inflammatories (NSAID): Secondary | ICD-10-CM | POA: Insufficient documentation

## 2015-11-09 DIAGNOSIS — M545 Low back pain: Secondary | ICD-10-CM | POA: Insufficient documentation

## 2015-11-09 DIAGNOSIS — Z87442 Personal history of urinary calculi: Secondary | ICD-10-CM | POA: Insufficient documentation

## 2015-11-09 DIAGNOSIS — I10 Essential (primary) hypertension: Secondary | ICD-10-CM | POA: Insufficient documentation

## 2015-11-09 DIAGNOSIS — Z7984 Long term (current) use of oral hypoglycemic drugs: Secondary | ICD-10-CM | POA: Insufficient documentation

## 2015-11-09 DIAGNOSIS — Z79899 Other long term (current) drug therapy: Secondary | ICD-10-CM | POA: Diagnosis not present

## 2015-11-09 DIAGNOSIS — M1990 Unspecified osteoarthritis, unspecified site: Secondary | ICD-10-CM | POA: Diagnosis not present

## 2015-11-09 DIAGNOSIS — Z9889 Other specified postprocedural states: Secondary | ICD-10-CM | POA: Insufficient documentation

## 2015-11-09 DIAGNOSIS — Z7901 Long term (current) use of anticoagulants: Secondary | ICD-10-CM | POA: Insufficient documentation

## 2015-11-09 DIAGNOSIS — M109 Gout, unspecified: Secondary | ICD-10-CM | POA: Diagnosis not present

## 2015-11-09 DIAGNOSIS — I4892 Unspecified atrial flutter: Secondary | ICD-10-CM | POA: Insufficient documentation

## 2015-11-09 DIAGNOSIS — M5489 Other dorsalgia: Secondary | ICD-10-CM

## 2015-11-09 DIAGNOSIS — E119 Type 2 diabetes mellitus without complications: Secondary | ICD-10-CM | POA: Insufficient documentation

## 2015-11-09 DIAGNOSIS — Z8546 Personal history of malignant neoplasm of prostate: Secondary | ICD-10-CM | POA: Insufficient documentation

## 2015-11-09 DIAGNOSIS — Z87891 Personal history of nicotine dependence: Secondary | ICD-10-CM | POA: Insufficient documentation

## 2015-11-09 MED ORDER — CYCLOBENZAPRINE HCL 5 MG PO TABS: 5.0000 mg | ORAL_TABLET | Freq: Three times a day (TID) | ORAL | Status: AC | PRN

## 2015-11-09 MED ORDER — OXYCODONE-ACETAMINOPHEN 5-325 MG PO TABS: 1.0000 | ORAL_TABLET | Freq: Four times a day (QID) | ORAL | Status: AC | PRN

## 2015-11-09 MED ORDER — OXYCODONE-ACETAMINOPHEN 5-325 MG PO TABS
2.0000 | ORAL_TABLET | Freq: Once | ORAL | Status: AC
  Administered 2015-11-09: 2 via ORAL
  Filled 2015-11-09: qty 2

## 2015-11-09 NOTE — ED Provider Notes (Signed)
CSN: [id number]     Arrival date & time 11/09/15  1032 History   First MD Initiated Contact with Patient 11/09/15 1042     Chief Complaint  Patient presents with  . Back Pain     (Consider location/radiation/quality/duration/timing/severity/associated sxs/prior Treatment) HPI  Pt presenting with low back pain.  He states pain began one morning when he woke up approx 2 weeks ago.  Pain is constant and worse with movements.  He finds it difficult to find a comfortable position. No leg weakness. No fever/chills.  No urinary retention.  No incontinence of bowel or bladder.  No preceding trauma.  Pt states he saw his PMD 3 days ago and xrays were performed which showed no acute findings- but did show arthritis.  He has an MRI scheduled in approx 3 days to further evaluate.  He states he was told by his doctor that he would not give pain medicine until he knew what he was treating.  Pt states the pain is causing him to lose sleep at night.  There are no other associated systemic symptoms, there are no other alleviating or modifying factors.   Past Medical History  Diagnosis Date  . Arthritis   . Gout   . Diabetes mellitus   . Hypertension   . High cholesterol   . Atrial flutter ([place])     was placed on coumadin   . Chronic anticoagulation   . Prostate cancer ([place])   . Bradycardia   . Kidney stones    Past Surgical History  Procedure Laterality Date  . Kidney stone surgery    . Thyroid surgery    . Kidney surgery    . Shoulder debridement      L and R  . Prostate surgery      had prostate cancer approx. 3 yrs ago  . Total knee arthroplasty      right  . Back surgery    . Joint replacement     No family history on file. Social History  Substance Use Topics  . Smoking status: Former Research scientist (life sciences)  . Smokeless tobacco: Never Used     Comment: Quit 35 years ago  . Alcohol Use: Yes     Comment: occasional    Review of Systems  ROS reviewed and all otherwise negative except for  mentioned in HPI    Allergies  Penicillins  Home Medications   Prior to Admission medications   Medication Sig Start Date End Date Taking? Authorizing Provider  allopurinol (ZYLOPRIM) 300 MG tablet Take 300 mg by mouth daily.   Yes Historical Provider, MD  chlorthalidone (HYGROTON) 25 MG tablet Take 25 mg by mouth daily.   Yes Historical Provider, MD  fenofibrate 160 MG tablet Take 160 mg by mouth daily.   Yes Historical Provider, MD  furosemide (LASIX) 20 MG tablet Take 1 tablet (20 mg total) by mouth 2 (two) times daily. 01/18/13  Yes Veryl Speak, MD  gabapentin (NEURONTIN) 300 MG capsule Take 300 mg by mouth 3 (three) times daily.   Yes Historical Provider, MD  insulin glargine (LANTUS) 100 UNIT/ML injection Inject 70 Units into the skin at bedtime.    Yes Historical Provider, MD  insulin lispro (HUMALOG) 100 UNIT/ML injection Inject 20 Units into the skin 3 (three) times daily before meals.    Yes Historical Provider, MD  losartan-hydrochlorothiazide (HYZAAR) 100-12.5 MG per tablet Take 1 tablet by mouth daily.   Yes Historical Provider, MD  metFORMIN (GLUCOPHAGE) 500 MG tablet Take  1,000 mg by mouth 2 (two) times daily with a meal.   Yes Historical Provider, MD  pregabalin (LYRICA) 100 MG capsule Take 200 mg by mouth 3 (three) times daily.    Yes Historical Provider, MD  warfarin (COUMADIN) 5 MG tablet Take 7.5-10 mg by mouth daily. Take 10 MG on Mondays and Tuesdays; on Wednesday, Thursday, Friday, Saturday, and Sunday, take 7.5 MG   Yes Historical Provider, MD  cyclobenzaprine (FLEXERIL) 5 MG tablet Take 1 tablet (5 mg total) by mouth 3 (three) times daily as needed for muscle spasms. 11/09/15   Alfonzo Beers, MD  HYDROcodone-acetaminophen (NORCO/VICODIN) 5-325 MG per tablet Take 1-2 tablets by mouth every 6 (six) hours as needed for moderate pain. 04/10/14   Fredia Sorrow, MD  omeprazole (PRILOSEC) 20 MG capsule Take 40 mg by mouth daily.    Historical Provider, MD   oxyCODONE-acetaminophen (PERCOCET/ROXICET) 5-325 MG tablet Take 1-2 tablets by mouth every 6 (six) hours as needed for severe pain. 11/09/15   Alfonzo Beers, MD  polyethylene glycol (MIRALAX / GLYCOLAX) packet Take 17 g by mouth daily. 01/28/13   Fredia Sorrow, MD  simvastatin (ZOCOR) 40 MG tablet Take 40 mg by mouth every evening.    Historical Provider, MD  traMADol (ULTRAM) 50 MG tablet Take 100 mg by mouth 4 (four) times daily.     Historical Provider, MD  zolpidem (AMBIEN) 5 MG tablet Take 1 tablet (5 mg total) by mouth at bedtime as needed for sleep. 01/18/13   Veryl Speak, MD   BP 164/89 mmHg  Pulse 61  Temp(Src) 97.8 F (36.6 C) (Oral)  Resp 18  Ht 6' (1.829 m)  Wt 255 lb (115.667 kg)  BMI 34.58 kg/m2  SpO2 96%  Vitals reviewed Physical Exam  Physical Examination: General appearance - alert, well appearing, and in no distress Mental status - alert, oriented to person, place, and time Eyes - no conjunctival injection no scleral icterus Chest - clear to auscultation, no wheezes, rales or rhonchi, symmetric air entry Heart - normal rate, regular rhythm, normal S1, S2, no murmurs, rubs, clicks or gallops Back exam - no midline tendenress of t/l spine, no CVA tenderness.  Some bilateral lumbar paraspinal tenderness to palpation Neurological - alert, oriented, normal speech, strength 5/5 in lower extremities bilaterally, sensation intact Musculoskeletal - no joint tenderness, deformity or swelling Extremities - peripheral pulses normal, no pedal edema, no clubbing or cyanosis Skin - normal coloration and turgor, no rashes  ED Course  Procedures (including critical care time) Labs Review Labs Reviewed - No data to display  Imaging Review No results found. I have personally reviewed and evaluated these images and lab results as part of my medical decision-making.   EKG Interpretation None      MDM   Final diagnoses:  Back pain without sciatica    Pt presenting with  low back pain which has been ongoing x 2 weeks.  No signs or symptoms of cauda equina.  No fever to suggest epidural abscess.  Pt has taken ibuprofen for pain and not helping.  Has MRI scheduled as an outpatient later this week.  No indication for emergent MRI at this time.  Pt given percocet and short rx for percocet and flexeril to help with pain while awaiting MRI and advised close f/u with PMD.  Discharged with strict return precautions.  Pt agreeable with plan.    Alfonzo Beers, MD 11/09/15 1149

## 2015-11-09 NOTE — ED Notes (Signed)
Rx x 2 given for flexeril and percocet. Pt d/c home with ride

## 2015-11-09 NOTE — ED Notes (Signed)
MD at bedside. 

## 2015-11-09 NOTE — ED Notes (Signed)
Pt has hx of back pain and is scheduled for MRI on Wednesday. Reports pain worse x 5 days, low back, worse with movement

## 2015-11-09 NOTE — ED Notes (Signed)
Family at bedside. 

## 2015-11-09 NOTE — Discharge Instructions (Signed)
Return to the ED with any concerns including weakness of legs, not able to urinate, loss of control of bowel or bladder, fever/chills, decreased level of alertness/lethargy, or any other alarming symptoms °

## 2016-03-28 IMAGING — CR DG ELBOW COMPLETE 3+V*R*
4 series · 4 of 4 positions shown · non-contrast
Comparison: Study obtained earlier in the day

CLINICAL DATA: Pain post trauma

EXAM:
RIGHT ELBOW - COMPLETE 3+ VIEW

[x elbow joint obl. right (1 of 2)]
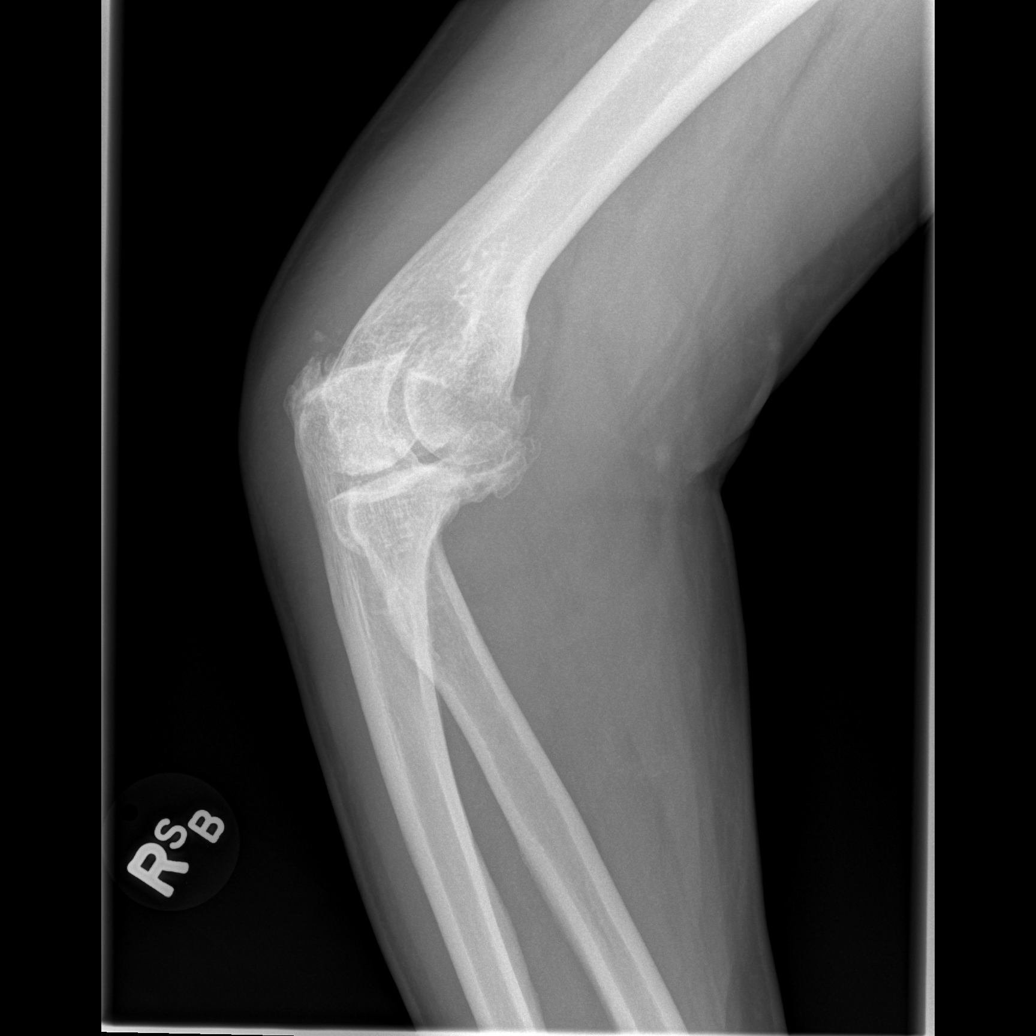

[x elbow joint lat right]
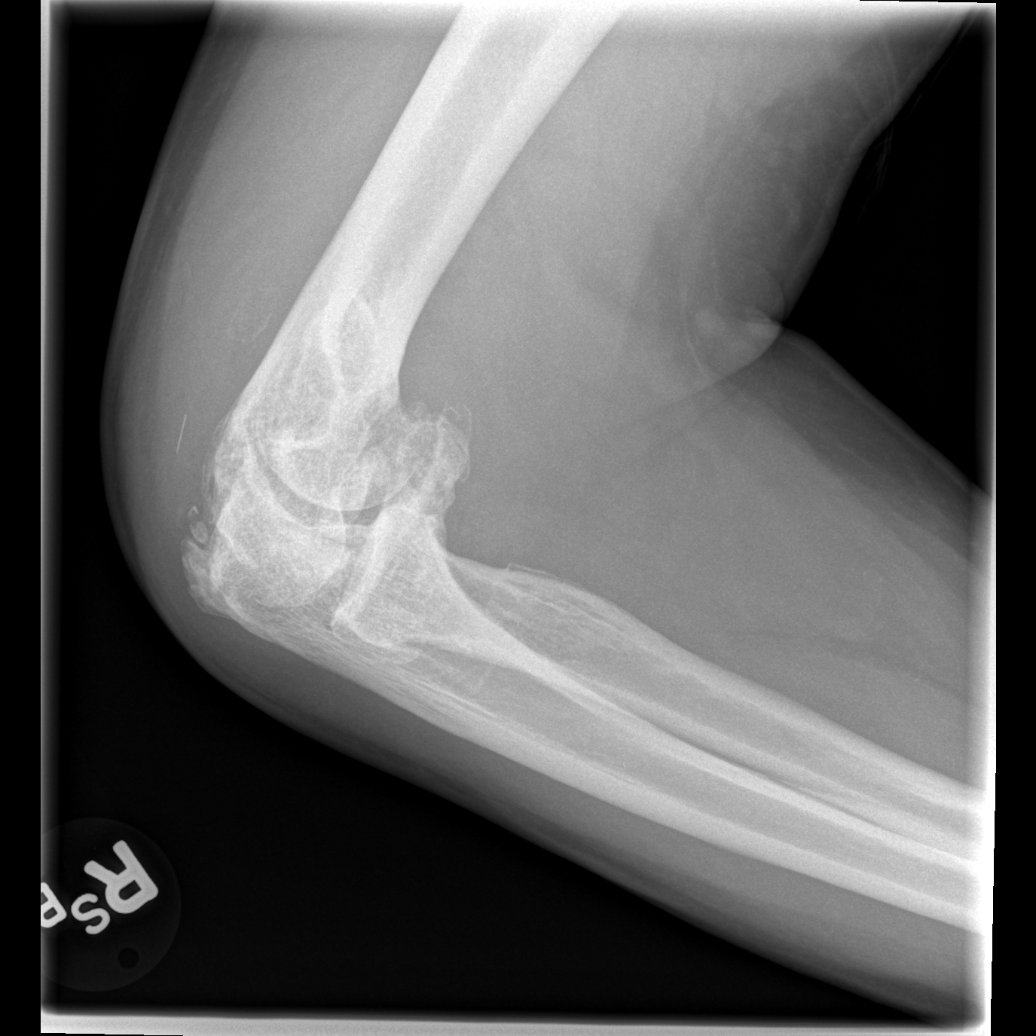

[x elbow joint ap right]
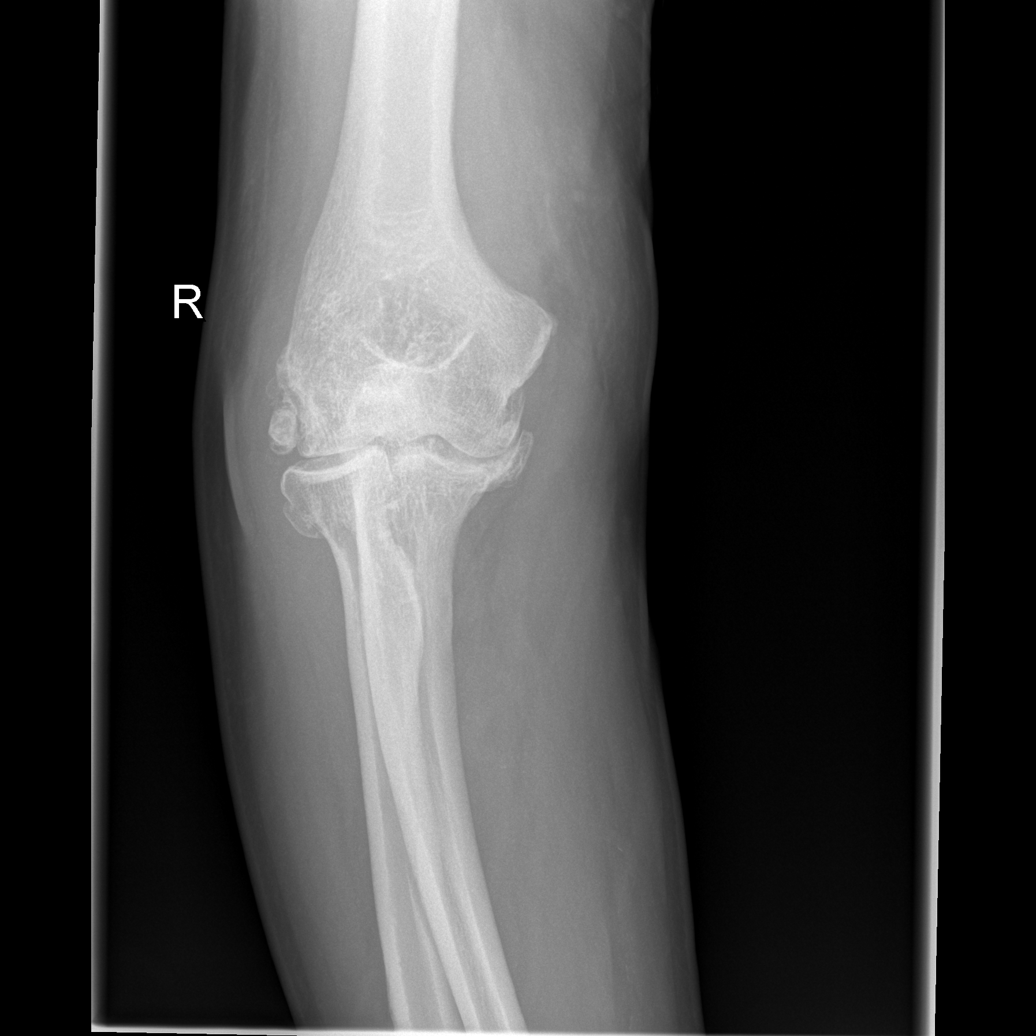

[x elbow joint obl. right (2 of 2)]
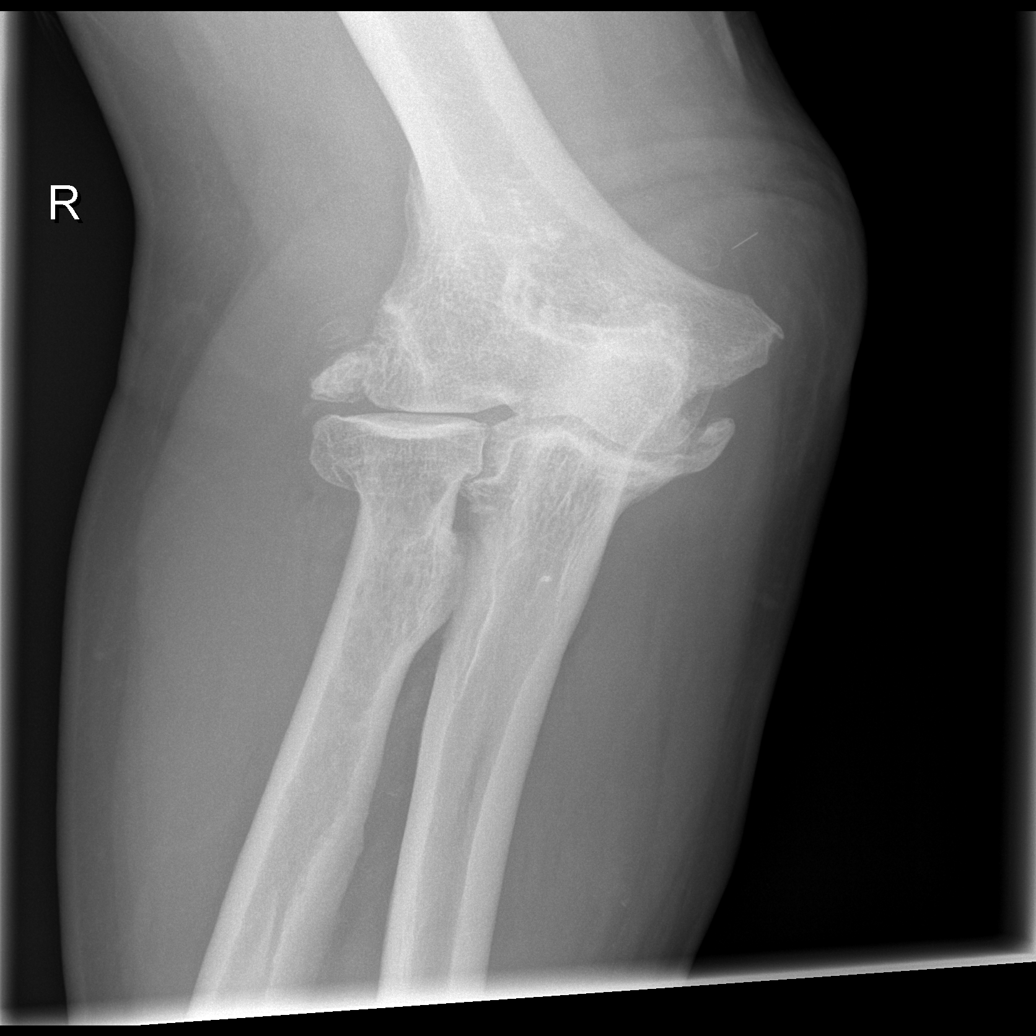

[4 of 4 positions shown; findings below may reference images not displayed]

FINDINGS: Frontal, lateral, and bilateral oblique views were obtained. There
is no appreciable fracture, dislocation, or effusion. There is
advanced osteoarthritic change with extensive spurring and joint
space narrowing. There is no erosive change or bony destruction.
There is a linear metallic foreign body located posterior and medial
to the distal humerus.
IMPRESSION: Extensive osteoarthritic change. No demonstrable fracture or
dislocation. No appreciable joint effusion. Linear metallic foreign
body located posterior and medial to the distal humeral metaphysis.
Question needle fragment.

## 2017-12-22 DEATH — deceased
